# Patient Record
Sex: Female | Born: 1970 | Race: White | Hispanic: Yes | Marital: Married | Smoking: Never smoker
Health system: Southern US, Community
[De-identification: ages and names within clinical notes are randomized; demographics above are authoritative.]

## PROBLEM LIST (undated history)

## (undated) DIAGNOSIS — T7840XA Allergy, unspecified, initial encounter: Secondary | ICD-10-CM

## (undated) DIAGNOSIS — M810 Age-related osteoporosis without current pathological fracture: Secondary | ICD-10-CM

## (undated) DIAGNOSIS — E119 Type 2 diabetes mellitus without complications: Secondary | ICD-10-CM

## (undated) DIAGNOSIS — I4891 Unspecified atrial fibrillation: Secondary | ICD-10-CM

## (undated) DIAGNOSIS — J309 Allergic rhinitis, unspecified: Secondary | ICD-10-CM

## (undated) DIAGNOSIS — E079 Disorder of thyroid, unspecified: Secondary | ICD-10-CM

## (undated) DIAGNOSIS — L719 Rosacea, unspecified: Secondary | ICD-10-CM

## (undated) DIAGNOSIS — M8580 Other specified disorders of bone density and structure, unspecified site: Secondary | ICD-10-CM

## (undated) DIAGNOSIS — G4730 Sleep apnea, unspecified: Secondary | ICD-10-CM

## (undated) DIAGNOSIS — A6000 Herpesviral infection of urogenital system, unspecified: Secondary | ICD-10-CM

## (undated) HISTORY — DX: Herpesviral infection of urogenital system, unspecified: A60.00

## (undated) HISTORY — DX: Unspecified atrial fibrillation: I48.91

## (undated) HISTORY — DX: Rosacea, unspecified: L71.9

## (undated) HISTORY — DX: Disorder of thyroid, unspecified: E07.9

## (undated) HISTORY — DX: Age-related osteoporosis without current pathological fracture: M81.0

## (undated) HISTORY — DX: Allergic rhinitis, unspecified: J30.9

## (undated) HISTORY — PX: LAPAROSCOPIC GASTRIC BAND REMOVAL WITH LAPAROSCOPIC GASTRIC SLEEVE RESECTION: SHX6498

## (undated) HISTORY — DX: Sleep apnea, unspecified: G47.30

## (undated) HISTORY — DX: Type 2 diabetes mellitus without complications: E11.9

## (undated) HISTORY — DX: Other specified disorders of bone density and structure, unspecified site: M85.80

## (undated) HISTORY — PX: THYROID SURGERY: SHX805

## (undated) HISTORY — PX: TUBAL LIGATION: SHX77

## (undated) HISTORY — DX: Allergy, unspecified, initial encounter: T78.40XA

---

## 1998-12-15 ENCOUNTER — Emergency Department (HOSPITAL_COMMUNITY): Admission: EM | Admit: 1998-12-15 | Discharge: 1998-12-16 | Payer: Self-pay | Admitting: Emergency Medicine

## 1999-04-01 ENCOUNTER — Other Ambulatory Visit: Admission: RE | Admit: 1999-04-01 | Discharge: 1999-04-01 | Payer: Self-pay | Admitting: Obstetrics and Gynecology

## 1999-07-24 ENCOUNTER — Ambulatory Visit (HOSPITAL_COMMUNITY): Admission: RE | Admit: 1999-07-24 | Discharge: 1999-07-24 | Payer: Self-pay | Admitting: Obstetrics and Gynecology

## 1999-10-04 ENCOUNTER — Inpatient Hospital Stay (HOSPITAL_COMMUNITY): Admission: AD | Admit: 1999-10-04 | Discharge: 1999-10-05 | Payer: Self-pay | Admitting: *Deleted

## 1999-10-04 ENCOUNTER — Encounter (INDEPENDENT_AMBULATORY_CARE_PROVIDER_SITE_OTHER): Payer: Self-pay | Admitting: Specialist

## 1999-10-30 ENCOUNTER — Inpatient Hospital Stay (HOSPITAL_COMMUNITY): Admission: EM | Admit: 1999-10-30 | Discharge: 1999-10-30 | Payer: Self-pay | Admitting: Obstetrics

## 2000-06-14 HISTORY — PX: OTHER SURGICAL HISTORY: SHX169

## 2000-09-15 ENCOUNTER — Encounter: Admission: RE | Admit: 2000-09-15 | Discharge: 2000-09-15 | Payer: Self-pay | Admitting: *Deleted

## 2000-09-15 ENCOUNTER — Encounter: Payer: Self-pay | Admitting: Pediatrics

## 2002-03-13 ENCOUNTER — Other Ambulatory Visit: Admission: RE | Admit: 2002-03-13 | Discharge: 2002-03-13 | Payer: Self-pay | Admitting: Obstetrics and Gynecology

## 2004-04-01 ENCOUNTER — Ambulatory Visit: Payer: Self-pay | Admitting: Internal Medicine

## 2004-04-25 DIAGNOSIS — B009 Herpesviral infection, unspecified: Secondary | ICD-10-CM

## 2004-06-04 ENCOUNTER — Ambulatory Visit: Payer: Self-pay | Admitting: Internal Medicine

## 2004-06-05 ENCOUNTER — Ambulatory Visit: Payer: Self-pay | Admitting: *Deleted

## 2004-06-11 ENCOUNTER — Encounter (HOSPITAL_COMMUNITY): Admission: RE | Admit: 2004-06-11 | Discharge: 2004-09-09 | Payer: Self-pay | Admitting: Internal Medicine

## 2004-06-19 ENCOUNTER — Ambulatory Visit: Payer: Self-pay | Admitting: Internal Medicine

## 2004-06-25 ENCOUNTER — Ambulatory Visit: Payer: Self-pay | Admitting: Family Medicine

## 2004-06-25 ENCOUNTER — Inpatient Hospital Stay (HOSPITAL_COMMUNITY): Admission: EM | Admit: 2004-06-25 | Discharge: 2004-06-27 | Payer: Self-pay | Admitting: Emergency Medicine

## 2004-06-25 ENCOUNTER — Ambulatory Visit: Payer: Self-pay | Admitting: Cardiology

## 2004-06-26 ENCOUNTER — Encounter: Payer: Self-pay | Admitting: Cardiology

## 2004-06-30 ENCOUNTER — Ambulatory Visit: Payer: Self-pay | Admitting: Internal Medicine

## 2004-07-15 ENCOUNTER — Ambulatory Visit: Payer: Self-pay | Admitting: Internal Medicine

## 2004-07-31 ENCOUNTER — Ambulatory Visit: Payer: Self-pay | Admitting: Family Medicine

## 2004-08-07 ENCOUNTER — Ambulatory Visit: Payer: Self-pay | Admitting: Internal Medicine

## 2004-08-14 ENCOUNTER — Encounter (HOSPITAL_COMMUNITY): Admission: RE | Admit: 2004-08-14 | Discharge: 2004-11-12 | Payer: Self-pay | Admitting: Internal Medicine

## 2004-10-05 ENCOUNTER — Ambulatory Visit: Payer: Self-pay | Admitting: Internal Medicine

## 2004-10-13 ENCOUNTER — Ambulatory Visit: Payer: Self-pay | Admitting: Internal Medicine

## 2004-12-11 ENCOUNTER — Ambulatory Visit: Payer: Self-pay | Admitting: Internal Medicine

## 2004-12-24 ENCOUNTER — Ambulatory Visit: Payer: Self-pay | Admitting: Internal Medicine

## 2005-03-05 ENCOUNTER — Ambulatory Visit: Payer: Self-pay | Admitting: Internal Medicine

## 2005-03-10 ENCOUNTER — Ambulatory Visit: Payer: Self-pay | Admitting: Internal Medicine

## 2005-03-23 ENCOUNTER — Ambulatory Visit: Payer: Self-pay | Admitting: *Deleted

## 2005-06-04 ENCOUNTER — Ambulatory Visit: Payer: Self-pay | Admitting: Internal Medicine

## 2005-06-15 ENCOUNTER — Ambulatory Visit: Payer: Self-pay | Admitting: Internal Medicine

## 2005-06-22 ENCOUNTER — Ambulatory Visit: Payer: Self-pay | Admitting: Internal Medicine

## 2005-07-08 ENCOUNTER — Encounter (HOSPITAL_COMMUNITY): Admission: RE | Admit: 2005-07-08 | Discharge: 2005-10-06 | Payer: Self-pay | Admitting: Internal Medicine

## 2005-07-18 ENCOUNTER — Emergency Department (HOSPITAL_COMMUNITY): Admission: EM | Admit: 2005-07-18 | Discharge: 2005-07-18 | Payer: Self-pay | Admitting: Emergency Medicine

## 2005-07-26 ENCOUNTER — Ambulatory Visit: Payer: Self-pay | Admitting: Internal Medicine

## 2005-08-19 ENCOUNTER — Ambulatory Visit: Payer: Self-pay | Admitting: Internal Medicine

## 2005-09-17 ENCOUNTER — Ambulatory Visit: Payer: Self-pay | Admitting: Internal Medicine

## 2005-10-21 ENCOUNTER — Ambulatory Visit: Payer: Self-pay | Admitting: Internal Medicine

## 2005-12-13 ENCOUNTER — Ambulatory Visit: Payer: Self-pay | Admitting: Internal Medicine

## 2005-12-14 ENCOUNTER — Ambulatory Visit (HOSPITAL_COMMUNITY): Admission: RE | Admit: 2005-12-14 | Discharge: 2005-12-14 | Payer: Self-pay | Admitting: Internal Medicine

## 2005-12-21 ENCOUNTER — Ambulatory Visit (HOSPITAL_COMMUNITY): Admission: RE | Admit: 2005-12-21 | Discharge: 2005-12-21 | Payer: Self-pay | Admitting: Internal Medicine

## 2005-12-27 ENCOUNTER — Ambulatory Visit: Payer: Self-pay | Admitting: Internal Medicine

## 2006-01-04 ENCOUNTER — Ambulatory Visit: Payer: Self-pay | Admitting: Internal Medicine

## 2006-01-05 ENCOUNTER — Encounter (INDEPENDENT_AMBULATORY_CARE_PROVIDER_SITE_OTHER): Payer: Self-pay | Admitting: Family Medicine

## 2006-01-05 ENCOUNTER — Ambulatory Visit: Payer: Self-pay | Admitting: Family Medicine

## 2006-01-26 ENCOUNTER — Ambulatory Visit: Payer: Self-pay | Admitting: Family Medicine

## 2006-02-11 ENCOUNTER — Ambulatory Visit: Payer: Self-pay | Admitting: Family Medicine

## 2006-02-15 ENCOUNTER — Ambulatory Visit: Payer: Self-pay | Admitting: Internal Medicine

## 2006-04-18 ENCOUNTER — Ambulatory Visit: Payer: Self-pay | Admitting: Internal Medicine

## 2006-06-01 ENCOUNTER — Ambulatory Visit: Payer: Self-pay | Admitting: Internal Medicine

## 2006-09-01 ENCOUNTER — Ambulatory Visit: Payer: Self-pay | Admitting: Internal Medicine

## 2006-10-03 ENCOUNTER — Ambulatory Visit: Payer: Self-pay | Admitting: Family Medicine

## 2006-10-15 ENCOUNTER — Encounter (INDEPENDENT_AMBULATORY_CARE_PROVIDER_SITE_OTHER): Payer: Self-pay | Admitting: Family Medicine

## 2006-10-15 DIAGNOSIS — L719 Rosacea, unspecified: Secondary | ICD-10-CM

## 2006-10-15 DIAGNOSIS — E669 Obesity, unspecified: Secondary | ICD-10-CM | POA: Insufficient documentation

## 2006-10-15 HISTORY — DX: Rosacea, unspecified: L71.9

## 2006-12-21 ENCOUNTER — Ambulatory Visit: Payer: Self-pay | Admitting: Family Medicine

## 2007-03-01 ENCOUNTER — Encounter (INDEPENDENT_AMBULATORY_CARE_PROVIDER_SITE_OTHER): Payer: Self-pay | Admitting: *Deleted

## 2007-03-06 ENCOUNTER — Ambulatory Visit: Payer: Self-pay | Admitting: Nurse Practitioner

## 2007-03-06 ENCOUNTER — Encounter (INDEPENDENT_AMBULATORY_CARE_PROVIDER_SITE_OTHER): Payer: Self-pay | Admitting: Family Medicine

## 2007-03-08 LAB — CONVERTED CEMR LAB: TSH: 2.995 microintl units/mL (ref 0.350–5.50)

## 2007-03-17 ENCOUNTER — Telehealth (INDEPENDENT_AMBULATORY_CARE_PROVIDER_SITE_OTHER): Payer: Self-pay | Admitting: *Deleted

## 2007-04-05 ENCOUNTER — Ambulatory Visit: Payer: Self-pay | Admitting: Nurse Practitioner

## 2007-04-05 DIAGNOSIS — E018 Other iodine-deficiency related thyroid disorders and allied conditions: Secondary | ICD-10-CM | POA: Insufficient documentation

## 2007-04-05 DIAGNOSIS — I4891 Unspecified atrial fibrillation: Secondary | ICD-10-CM | POA: Insufficient documentation

## 2007-04-05 HISTORY — DX: Unspecified atrial fibrillation: I48.91

## 2007-04-06 ENCOUNTER — Encounter (INDEPENDENT_AMBULATORY_CARE_PROVIDER_SITE_OTHER): Payer: Self-pay | Admitting: Nurse Practitioner

## 2007-04-12 ENCOUNTER — Encounter (INDEPENDENT_AMBULATORY_CARE_PROVIDER_SITE_OTHER): Payer: Self-pay | Admitting: Nurse Practitioner

## 2007-10-23 ENCOUNTER — Telehealth (INDEPENDENT_AMBULATORY_CARE_PROVIDER_SITE_OTHER): Payer: Self-pay | Admitting: *Deleted

## 2007-10-23 ENCOUNTER — Ambulatory Visit: Payer: Self-pay | Admitting: Nurse Practitioner

## 2007-11-16 ENCOUNTER — Telehealth (INDEPENDENT_AMBULATORY_CARE_PROVIDER_SITE_OTHER): Payer: Self-pay | Admitting: Nurse Practitioner

## 2007-12-19 IMAGING — CT CT HEAD W/O CM
1 series · 16 of 30 positions shown, 20 images · IV contrast (agent unspecified)
Comparison: none

CLINICAL DATA: Headaches.  Diplopia.  
HEAD CT WITHOUT CONTRAST:
TECHNIQUE: Contiguous axial images were obtained from the base of the skull through the vertex according to standard protocol without contrast.

[Series 2: head_seq 5.0 h45s · axial · 0.43mm/px · z∈[+1209,+1343]mm · 16 of 30 slices shown, 20 images]
[im 2/30  brain]
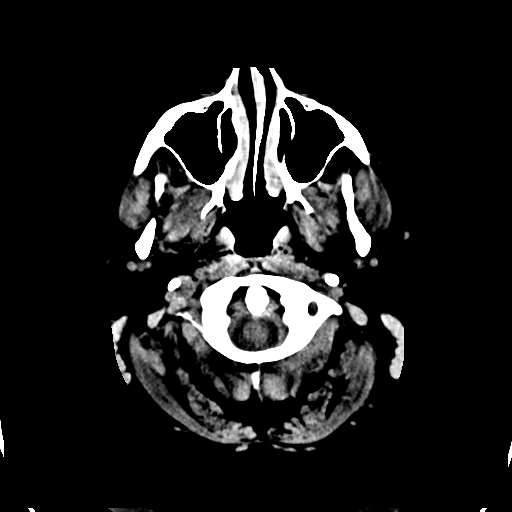
[im 2/30  bone]
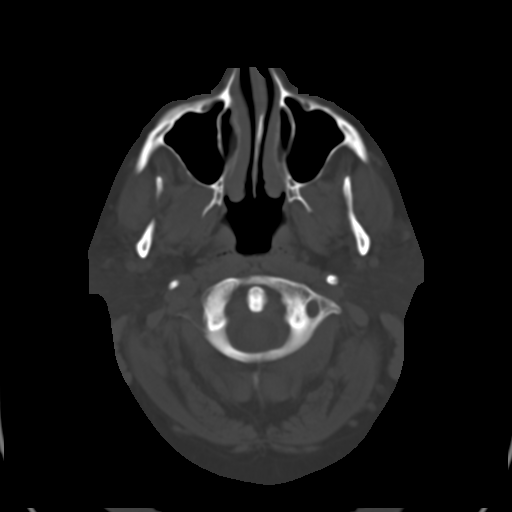
[im 4/30  brain]
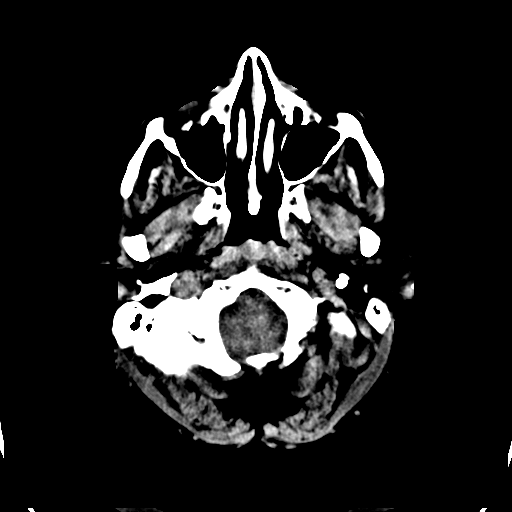
[im 6/30  brain]
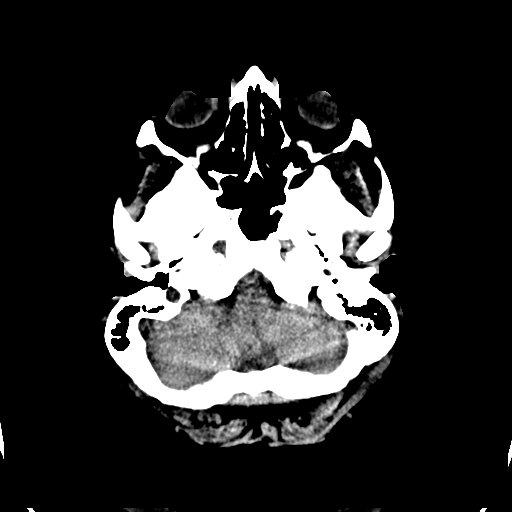
[im 8/30  brain]
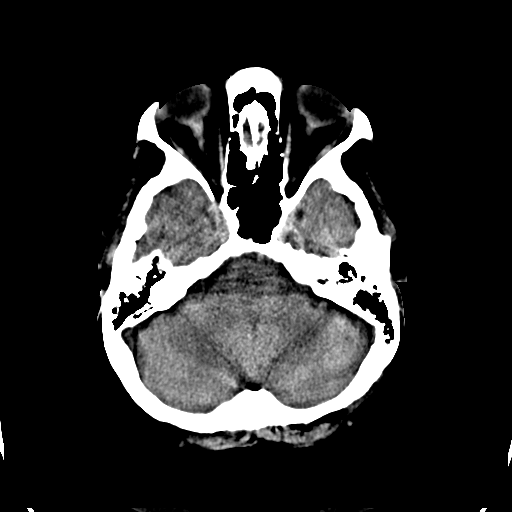
[im 9/30  brain]
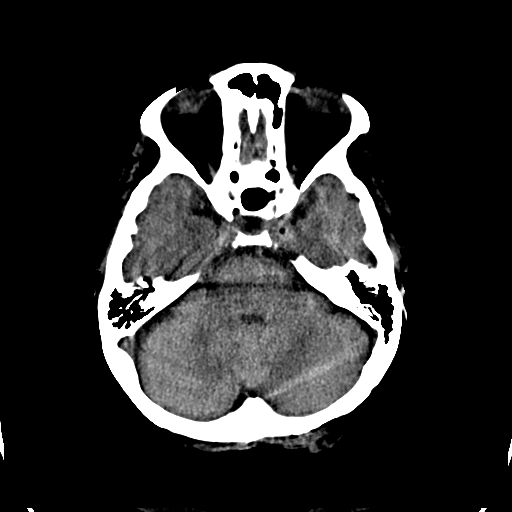
[im 9/30  bone]
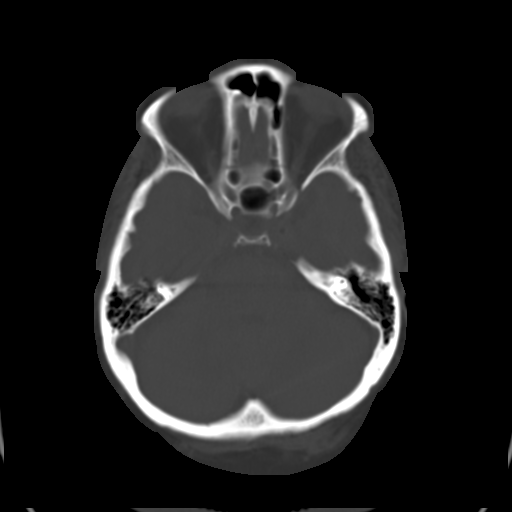
[im 11/30  brain]
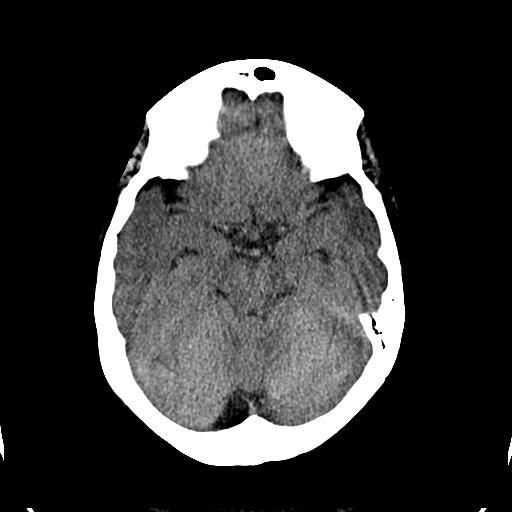
[im 13/30  brain]
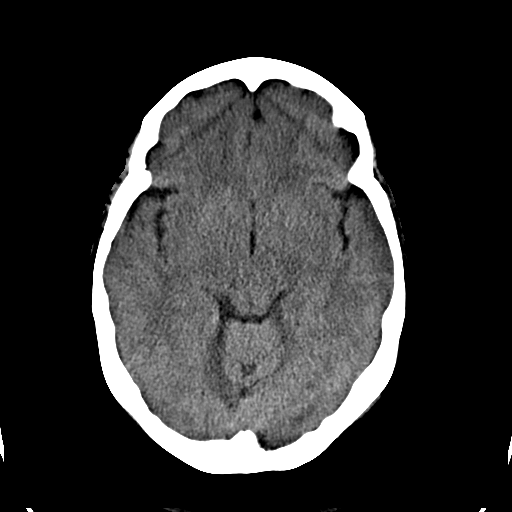
[im 15/30  brain]
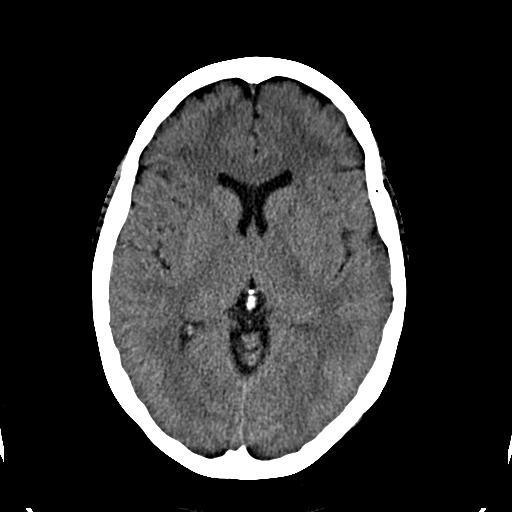
[im 16/30  brain]
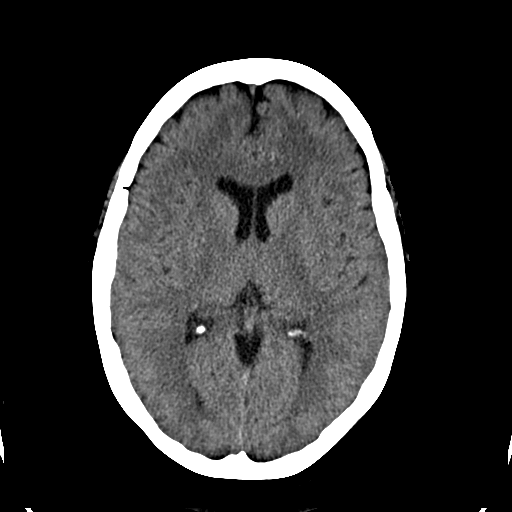
[im 16/30  bone]
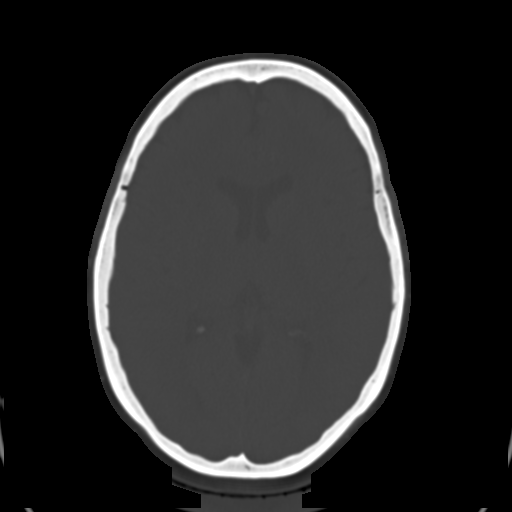
[im 18/30  brain]
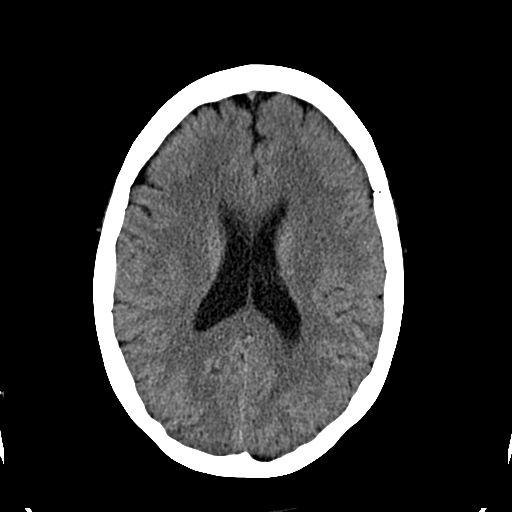
[im 20/30  brain]
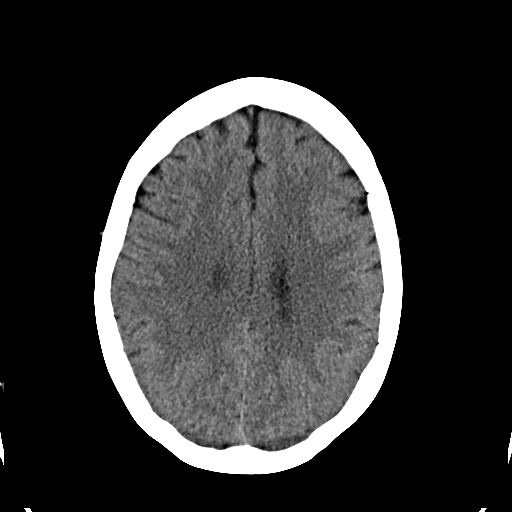
[im 22/30  brain]
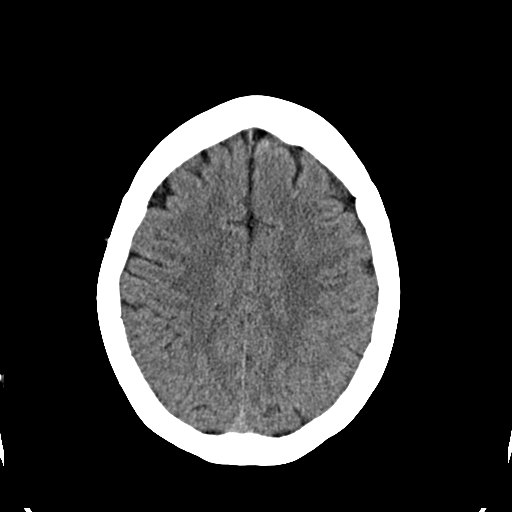
[im 23/30  brain]
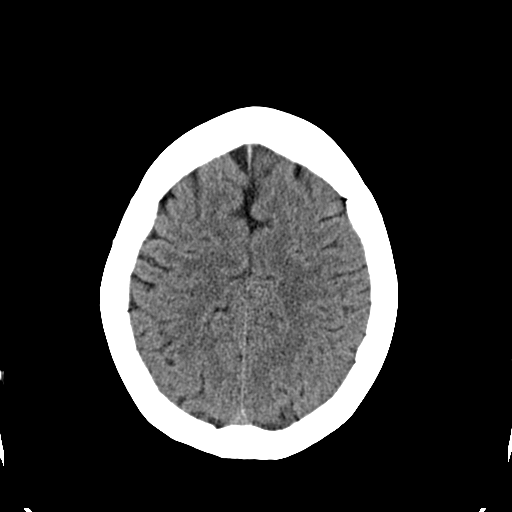
[im 23/30  bone]
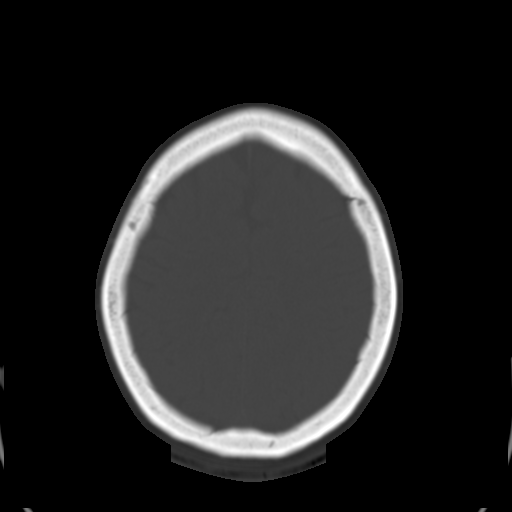
[im 25/30  brain]
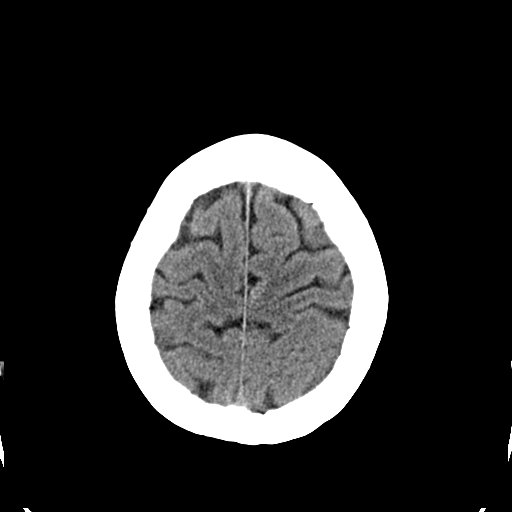
[im 27/30  brain]
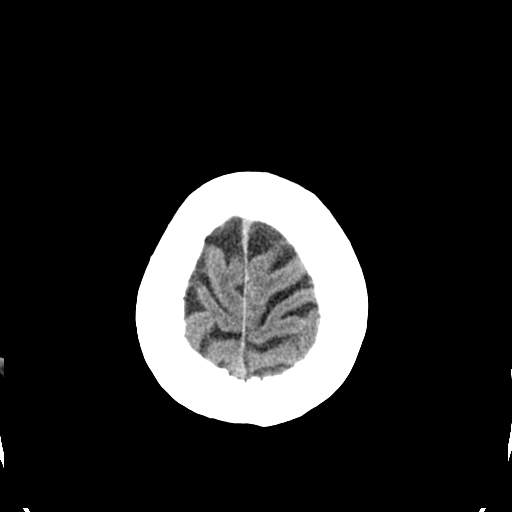
[im 29/30  brain]
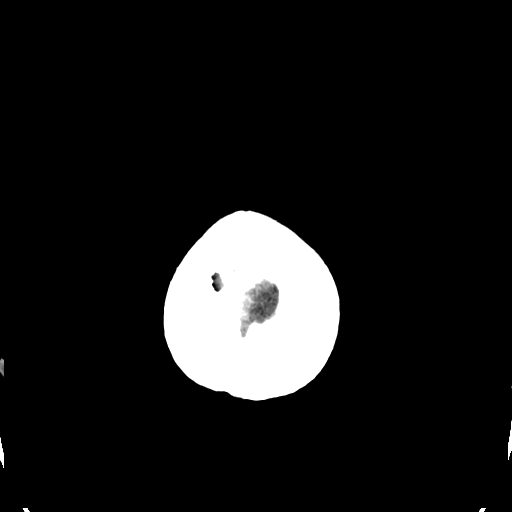

[16 of 30 positions shown; findings below may reference images not displayed]

FINDINGS: Fourth ventricle and its recesses have an attenuated appearance.  There is a ?crowded? appearance of the inferior aspect of the posterior fossa.  Findings raise the question of mass effect in that region possibly due to Chiari I malformation.  I am suspicious that the cerebellar tonsils are low lying.  MRI would be helpful for further assessment in that regard.  
No hydrocephalus.  Given the patient?s age, there are mild cortical atrophic changes involving the frontal lobes and mild cerebellar atrophic changes.  Suspicion for empty sella.
IMPRESSION: 1.  Suspicion for posterior fossa mass effect, e.g., Chiari I malformation.  Recommend MRI for further evaluation.  
2.  There are some findings described above plus reported clinical findings which raise the question of pseudotumor cerebri.

## 2008-01-24 ENCOUNTER — Ambulatory Visit: Payer: Self-pay | Admitting: Nurse Practitioner

## 2008-01-24 LAB — CONVERTED CEMR LAB: TSH: 1.379 microintl units/mL (ref 0.350–4.50)

## 2008-01-25 ENCOUNTER — Encounter (INDEPENDENT_AMBULATORY_CARE_PROVIDER_SITE_OTHER): Payer: Self-pay | Admitting: Nurse Practitioner

## 2008-03-14 ENCOUNTER — Ambulatory Visit: Payer: Self-pay | Admitting: Nurse Practitioner

## 2008-03-14 DIAGNOSIS — R3129 Other microscopic hematuria: Secondary | ICD-10-CM | POA: Insufficient documentation

## 2008-03-15 ENCOUNTER — Encounter (INDEPENDENT_AMBULATORY_CARE_PROVIDER_SITE_OTHER): Payer: Self-pay | Admitting: Nurse Practitioner

## 2008-03-21 ENCOUNTER — Encounter (INDEPENDENT_AMBULATORY_CARE_PROVIDER_SITE_OTHER): Payer: Self-pay | Admitting: Nurse Practitioner

## 2008-07-29 ENCOUNTER — Telehealth (INDEPENDENT_AMBULATORY_CARE_PROVIDER_SITE_OTHER): Payer: Self-pay | Admitting: Nurse Practitioner

## 2008-08-12 ENCOUNTER — Ambulatory Visit: Payer: Self-pay | Admitting: Nurse Practitioner

## 2008-08-13 ENCOUNTER — Encounter (INDEPENDENT_AMBULATORY_CARE_PROVIDER_SITE_OTHER): Payer: Self-pay | Admitting: Nurse Practitioner

## 2008-08-13 LAB — CONVERTED CEMR LAB: TSH: 0.96 microintl units/mL (ref 0.350–4.50)

## 2008-09-23 ENCOUNTER — Ambulatory Visit: Payer: Self-pay | Admitting: Nurse Practitioner

## 2008-09-24 ENCOUNTER — Encounter (INDEPENDENT_AMBULATORY_CARE_PROVIDER_SITE_OTHER): Payer: Self-pay | Admitting: Nurse Practitioner

## 2008-10-04 ENCOUNTER — Ambulatory Visit: Payer: Self-pay | Admitting: Nurse Practitioner

## 2008-10-04 DIAGNOSIS — J309 Allergic rhinitis, unspecified: Secondary | ICD-10-CM

## 2008-10-04 HISTORY — DX: Allergic rhinitis, unspecified: J30.9

## 2008-10-10 ENCOUNTER — Ambulatory Visit (HOSPITAL_COMMUNITY): Admission: RE | Admit: 2008-10-10 | Discharge: 2008-10-10 | Payer: Self-pay | Admitting: Nurse Practitioner

## 2008-10-10 ENCOUNTER — Encounter (INDEPENDENT_AMBULATORY_CARE_PROVIDER_SITE_OTHER): Payer: Self-pay | Admitting: Nurse Practitioner

## 2008-12-05 ENCOUNTER — Telehealth (INDEPENDENT_AMBULATORY_CARE_PROVIDER_SITE_OTHER): Payer: Self-pay | Admitting: Nurse Practitioner

## 2008-12-12 ENCOUNTER — Ambulatory Visit: Payer: Self-pay | Admitting: Physician Assistant

## 2008-12-13 ENCOUNTER — Encounter: Payer: Self-pay | Admitting: Physician Assistant

## 2008-12-13 LAB — CONVERTED CEMR LAB
BUN: 17 mg/dL (ref 6–23)
Basophils Absolute: 0 10*3/uL (ref 0.0–0.1)
CO2: 22 meq/L (ref 19–32)
Chloride: 105 meq/L (ref 96–112)
Creatinine, Ser: 0.73 mg/dL (ref 0.40–1.20)
Glucose, Bld: 80 mg/dL (ref 70–99)
HCT: 38.6 % (ref 36.0–46.0)
Hemoglobin: 12.1 g/dL (ref 12.0–15.0)
Lymphs Abs: 2.5 10*3/uL (ref 0.7–4.0)
MCHC: 31.3 g/dL (ref 30.0–36.0)
MCV: 78.1 fL (ref 78.0–100.0)
Monocytes Relative: 6 % (ref 3–12)
Neutrophils Relative %: 61 % (ref 43–77)
Platelets: 310 10*3/uL (ref 150–400)
RBC: 4.94 M/uL (ref 3.87–5.11)
RDW: 17.6 % — ABNORMAL HIGH (ref 11.5–15.5)
Sodium: 139 meq/L (ref 135–145)
TSH: 1.818 microintl units/mL (ref 0.350–4.500)

## 2008-12-24 ENCOUNTER — Telehealth (INDEPENDENT_AMBULATORY_CARE_PROVIDER_SITE_OTHER): Payer: Self-pay | Admitting: Nurse Practitioner

## 2009-03-06 ENCOUNTER — Ambulatory Visit: Payer: Self-pay | Admitting: Nurse Practitioner

## 2009-03-06 ENCOUNTER — Encounter (INDEPENDENT_AMBULATORY_CARE_PROVIDER_SITE_OTHER): Payer: Self-pay | Admitting: Internal Medicine

## 2009-03-06 ENCOUNTER — Telehealth (INDEPENDENT_AMBULATORY_CARE_PROVIDER_SITE_OTHER): Payer: Self-pay | Admitting: Nurse Practitioner

## 2009-03-06 LAB — CONVERTED CEMR LAB
Protein, U semiquant: NEGATIVE
pH: 6

## 2009-03-14 ENCOUNTER — Encounter (INDEPENDENT_AMBULATORY_CARE_PROVIDER_SITE_OTHER): Payer: Self-pay | Admitting: Nurse Practitioner

## 2009-03-14 ENCOUNTER — Ambulatory Visit: Payer: Self-pay | Admitting: Physician Assistant

## 2009-03-18 ENCOUNTER — Encounter (INDEPENDENT_AMBULATORY_CARE_PROVIDER_SITE_OTHER): Payer: Self-pay | Admitting: Nurse Practitioner

## 2009-03-18 LAB — CONVERTED CEMR LAB
Hemoglobin: 13 g/dL (ref 12.0–15.0)
MCHC: 30.7 g/dL (ref 30.0–36.0)
MCV: 87 fL (ref 78.0–100.0)
Platelets: 286 10*3/uL (ref 150–400)
RBC: 4.86 M/uL (ref 3.87–5.11)
WBC: 5.6 10*3/uL (ref 4.0–10.5)

## 2009-06-30 ENCOUNTER — Ambulatory Visit: Payer: Self-pay | Admitting: Nurse Practitioner

## 2009-06-30 LAB — CONVERTED CEMR LAB
HCT: 42.1 % (ref 36.0–46.0)
Hemoglobin: 13.1 g/dL (ref 12.0–15.0)
MCHC: 31.1 g/dL (ref 30.0–36.0)
MCV: 83.9 fL (ref 78.0–100.0)
Platelets: 303 10*3/uL (ref 150–400)

## 2009-07-01 ENCOUNTER — Encounter (INDEPENDENT_AMBULATORY_CARE_PROVIDER_SITE_OTHER): Payer: Self-pay | Admitting: Nurse Practitioner

## 2009-07-15 ENCOUNTER — Ambulatory Visit: Payer: Self-pay | Admitting: Nurse Practitioner

## 2009-07-15 LAB — CONVERTED CEMR LAB
ALT: 15 units/L (ref 0–35)
AST: 16 units/L (ref 0–37)
Albumin: 4.1 g/dL (ref 3.5–5.2)
Alkaline Phosphatase: 83 units/L (ref 39–117)
BUN: 17 mg/dL (ref 6–23)
Bilirubin Urine: NEGATIVE
Calcium: 9 mg/dL (ref 8.4–10.5)
Glucose, Bld: 73 mg/dL (ref 70–99)
KOH Prep: NEGATIVE
Nitrite: NEGATIVE
Potassium: 4.4 meq/L (ref 3.5–5.3)
Sodium: 136 meq/L (ref 135–145)
Total Protein: 7.8 g/dL (ref 6.0–8.3)
Triglycerides: 86 mg/dL (ref <150)
Urobilinogen, UA: 0.2
VLDL: 17 mg/dL (ref 0–40)

## 2009-07-16 ENCOUNTER — Encounter (INDEPENDENT_AMBULATORY_CARE_PROVIDER_SITE_OTHER): Payer: Self-pay | Admitting: Nurse Practitioner

## 2009-07-23 LAB — CONVERTED CEMR LAB: Pap Smear: NEGATIVE

## 2009-08-29 ENCOUNTER — Ambulatory Visit: Payer: Self-pay | Admitting: Diagnostic Radiology

## 2009-08-29 ENCOUNTER — Emergency Department (HOSPITAL_BASED_OUTPATIENT_CLINIC_OR_DEPARTMENT_OTHER): Admission: EM | Admit: 2009-08-29 | Discharge: 2009-08-29 | Payer: Self-pay | Admitting: Emergency Medicine

## 2009-11-18 ENCOUNTER — Ambulatory Visit: Payer: Self-pay | Admitting: Family Medicine

## 2009-11-18 DIAGNOSIS — H8110 Benign paroxysmal vertigo, unspecified ear: Secondary | ICD-10-CM

## 2009-12-10 ENCOUNTER — Ambulatory Visit: Payer: Self-pay | Admitting: Family Medicine

## 2009-12-10 DIAGNOSIS — I10 Essential (primary) hypertension: Secondary | ICD-10-CM | POA: Insufficient documentation

## 2009-12-24 ENCOUNTER — Encounter: Admission: RE | Admit: 2009-12-24 | Discharge: 2010-03-10 | Payer: Self-pay | Admitting: Family Medicine

## 2009-12-24 ENCOUNTER — Encounter: Payer: Self-pay | Admitting: Family Medicine

## 2010-01-08 ENCOUNTER — Ambulatory Visit: Payer: Self-pay | Admitting: Family Medicine

## 2010-01-11 ENCOUNTER — Encounter: Payer: Self-pay | Admitting: Family Medicine

## 2010-02-11 ENCOUNTER — Ambulatory Visit: Payer: Self-pay | Admitting: Family Medicine

## 2010-02-11 DIAGNOSIS — R6882 Decreased libido: Secondary | ICD-10-CM

## 2010-03-13 ENCOUNTER — Ambulatory Visit: Payer: Self-pay | Admitting: Family Medicine

## 2010-03-21 ENCOUNTER — Telehealth: Payer: Self-pay | Admitting: Family Medicine

## 2010-04-23 ENCOUNTER — Ambulatory Visit: Payer: Self-pay | Admitting: Family Medicine

## 2010-04-23 ENCOUNTER — Encounter: Payer: Self-pay | Admitting: Family Medicine

## 2010-04-23 LAB — CONVERTED CEMR LAB
Platelets: 308 10*3/uL (ref 150–400)
RDW: 15.9 % — ABNORMAL HIGH (ref 11.5–15.5)
WBC: 8.4 10*3/uL (ref 4.0–10.5)

## 2010-04-27 ENCOUNTER — Telehealth: Payer: Self-pay | Admitting: Family Medicine

## 2010-05-04 ENCOUNTER — Ambulatory Visit: Payer: Self-pay | Admitting: Family Medicine

## 2010-05-05 ENCOUNTER — Telehealth: Payer: Self-pay | Admitting: Family Medicine

## 2010-05-05 ENCOUNTER — Telehealth: Payer: Self-pay | Admitting: *Deleted

## 2010-05-19 ENCOUNTER — Encounter: Payer: Self-pay | Admitting: Family Medicine

## 2010-07-12 LAB — CONVERTED CEMR LAB
ALT: 15 units/L (ref 0–35)
AST: 16 units/L (ref 0–37)
AST: 17 units/L (ref 0–37)
Albumin: 4.2 g/dL (ref 3.5–5.2)
BUN: 11 mg/dL (ref 6–23)
Basophils Relative: 0 % (ref 0–1)
Basophils Relative: 0 % (ref 0–1)
Bilirubin Urine: NEGATIVE
Bilirubin Urine: NEGATIVE
Blood in Urine, dipstick: NEGATIVE
CO2: 21 meq/L (ref 19–32)
Chloride: 103 meq/L (ref 96–112)
Chloride: 103 meq/L (ref 96–112)
Creatinine, Ser: 0.67 mg/dL (ref 0.40–1.20)
Creatinine, Ser: 0.75 mg/dL (ref 0.40–1.20)
Eosinophils Absolute: 0.1 10*3/uL (ref 0.0–0.7)
GC Probe Amp, Genital: NEGATIVE
Glucose, Bld: 83 mg/dL (ref 70–99)
HCT: 38 % (ref 36.0–46.0)
Hemoglobin: 11.7 g/dL — ABNORMAL LOW (ref 12.0–15.0)
KOH Prep: NEGATIVE
Ketones, urine, test strip: NEGATIVE
Ketones, urine, test strip: NEGATIVE
Lymphs Abs: 2.3 10*3/uL (ref 0.7–3.3)
Lymphs Abs: 2.6 10*3/uL (ref 0.7–4.0)
MCHC: 30.9 g/dL (ref 30.0–36.0)
MCV: 75.5 fL — ABNORMAL LOW (ref 78.0–100.0)
MCV: 77.2 fL — ABNORMAL LOW (ref 78.0–100.0)
Monocytes Absolute: 0.4 10*3/uL (ref 0.1–1.0)
Monocytes Relative: 6 % (ref 3–12)
Neutrophils Relative %: 59 % (ref 43–77)
Nitrite: NEGATIVE
Nitrite: NEGATIVE
Pap Smear: NORMAL
Platelets: 335 10*3/uL (ref 150–400)
Protein, U semiquant: NEGATIVE
RDW: 16.3 % — ABNORMAL HIGH (ref 11.5–15.5)
RDW: 16.7 % — ABNORMAL HIGH (ref 11.5–14.0)
Sodium: 139 meq/L (ref 135–145)
Sodium: 141 meq/L (ref 135–145)
TSH: 2.913 microintl units/mL (ref 0.350–4.50)
Total Bilirubin: 0.3 mg/dL (ref 0.3–1.2)
Total Protein: 7.7 g/dL (ref 6.0–8.3)
Total Protein: 7.8 g/dL (ref 6.0–8.3)
Urobilinogen, UA: 0.2
WBC: 7.7 10*3/uL (ref 4.0–10.5)
pH: 6

## 2010-07-14 NOTE — Assessment & Plan Note (Signed)
Summary: dizziness/weight/rosacea   Vital Signs:  Patient profile:   40 year old female Menstrual status:  regular Weight:      183.3 pounds Pulse rate:   94 / minute BP sitting:   132 / 88  (right arm)  Vitals Entered By: Arlyss Repress CMA, (April 23, 2010 2:50 PM) CC: f/up last OV   CC:  f/up last OV.  History of Present Illness: dizziness: dizziness once while standing cleaning table, again while sitting at a computer, again while going to lay down had dizziness episode.  lasts 2 seconds then resolves completely. no syncope. no nausea/ vomiting. no palpitations. no cp.  no sob   drinking less water since weather is now cold.  no dizziness when laying down.  Pt states that this is very different than when she had problems with vertigo.  weight: pt continues to work on weight loss.  Has been meeting with the nutritionist to discuss nutrition and exercise.  pt has lost approx 6 lbs.  pt is also taking phentermine for weight loss-  discuss the cons of taking this medication with pt at previous appt.  this is prescribed by a "bariatric clinic".  rosacea: pt has long hx of rosesea.  used a friends steroid cream on her face and this seemed to help.  states that sometimes the red area on her face gets dry patches as well.  no buring, no itching, no pimples.     Current Medications (verified): 1)  Excedrin Migraine 250-250-65 Mg Tabs (Aspirin-Acetaminophen-Caffeine) .... As Needed Migraines 2)  Synthroid 150 Mcg Tabs (Levothyroxine Sodium) .... One Tablet By Mouth Daily  For Thyroid 3)  Loratadine 10 Mg Tabs (Loratadine) .Marland Kitchen.. 1 Tablet By Mouth Daily For Allergies 4)  Phentermine Hcl 15 Mg Caps (Phentermine Hcl) .... Usure of Dosage 5)  Metrogel 1 % Gel (Metronidazole) .... Apply Small Amout To Red Areas On Face Once Daily  Allergies (verified): No Known Drug Allergies  Review of Systems       as per hpi  Physical Exam  General:  VSS orthostatics lying- 130/90   hr-70 standing 130/84  hr-80 obese ,in no acute distress; alert,appropriate and cooperative throughout examination Lungs:  Normal respiratory effort, chest expands symmetrically. Lungs are clear to auscultation, no crackles or wheezes. Heart:  Normal rate and regular rhythm. S1 and S2 normal without gallop, murmur, click, rub or other extra sounds. Extremities:  no edema Neurologic:  CN II-XII grossly intact, unable to cause dizziness or nystagmus with dix-hallpike manuver, good strenth in upper and lower ext.  good balance. normal gait Skin:  red macular/papular area on cheeks, nose and forehead Psych:  Cognition and judgment appear intact. Alert and cooperative with normal attention span and concentration. 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  Hbg lab test ordered.  The following medications were removed from the medication list:    Ferrous Sulfate 325 (65 Fe) Mg Tabs (Ferrous sulfate) ..... Hold (stopped early 2011)  Orders: CBC-[facility] (04540)  Problem # 2:  DIZZINESS (ICD-780.4) most likely a side effect of the phentermine.  (very common side effect).  Could also be 2/2 to decreased by mouth intake.  orthostatic vital signs negative today and I was unable to reproduce syptoms.    The following medications were removed from the medication list:    Antivert  25 Mg Tabs (Meclizine hcl) .Marland Kitchen... 1/2 tablet as needed dizziness Her updated medication list for this problem includes:    Loratadine 10 Mg Tabs (Loratadine) .Marland Kitchen... 1 tablet by mouth daily for allergies  Orders: [facility]- Est  Level 4 (16109)  Problem # 3:  OBESITY NOS (ICD-278.00) pt encouraged to continue exercise and healthy eating.  congratulated her on weight loss.  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  Pt is to avoid using the steroid cream that her friend gave her since this can cause thinning of the facial skin.    Orders: [facility]- Est  Level 4 (09811)  Complete Medication List: 1)  Excedrin Migraine 250-250-65 Mg Tabs (Aspirin-acetaminophen-caffeine) .... As needed migraines 2)  Synthroid 150 Mcg Tabs (Levothyroxine sodium) .... One tablet by mouth daily  for thyroid 3)  Loratadine 10 Mg Tabs (Loratadine) .Marland Kitchen.. 1 tablet by mouth daily for allergies 4)  Phentermine Hcl 15 Mg Caps (Phentermine hcl) .... Usure of dosage 5)  Metrogel 1 % Gel (Metronidazole) .... Apply small amout to red areas on face once daily  Other Orders: TSH-[facility] ([id number])  Patient Instructions: 1)  drink lots of fluids. 2)  I think your symptoms may be a side effect of the phentermine 3)  we will check your TSH and blood for anemia today. 4)  see the nutritionist as scheduled 5)  return to see me in 1-2 months for recheck on weight and to review screening form 6)  clean skin every morning and night with "cetaphil" 7)  use "purpose" lotion every day and night Prescriptions: METROGEL 1 % GEL (METRONIDAZOLE) apply small amout to red areas on face once daily  #1 x 3   Entered and Authorized by:   Ellin Mayhew MD   Signed by:   Ellin Mayhew MD on 04/24/2010   Method used:   Electronically to        [facility] (retail)       [address]       [place], [place]  [address]       Ph: [phone]       Fax: [phone]   RxID:   [id number]    Orders Added: 1)  TSH-[facility] [59563-87564] 2)  CBC-[facility] [85027] 3)  [facility]- Est  Level 4 [33295]

## 2010-07-14 NOTE — Progress Notes (Signed)
Summary: rx  Phone Note Call from Patient   Caller: Patient Summary of Call: Deon Pilling called to request generic for Metrogel 1% because it is to expens ($300)  and she can not buy it.  Initial call taken by: Marines Jean Rosenthal,  April 27, 2010 10:09 AM  Follow-up for Phone Call        will forward to MD. Follow-up by: Theresia Lo RN,  April 27, 2010 12:38 PM  Additional Follow-up for Phone Call Additional follow up Details #1::        Sent message via digital page. Additional Follow-up by: Dennison Nancy RN,  April 28, 2010 1:51 PM    Additional Follow-up for Phone Call Additional follow up Details #2::    flag sent to MD . Follow-up by: Theresia Lo RN,  April 29, 2010 11:40 AM  Additional Follow-up for Phone Call Additional follow up Details #3:: Details for Additional Follow-up Action Taken:  paged Dr.  Edmonia James  regarding above message and she states she contacted pharmacy yesterday  and has several options for patient . she will call patient to discuss. patient notified that MD will call.   Attempt x 1 to call pt.  Left VM.  Ellin Mayhew MD  April 30, 2010 3:50 PM  Additional Follow-up by: Theresia Lo RN,  April 29, 2010 2:29 PM  see note continued on 05/05/2010. Theresia Lo RN  May 05, 2010 9:27 AM

## 2010-07-14 NOTE — Assessment & Plan Note (Signed)
Summary: FLU SHOT/MJ  Nurse Visit   Vital Signs:  Patient profile:   40 year old female Menstrual status:  regular Temp:     98.6 degrees F  Vitals Entered By: Theresia Lo RN (May 04, 2010 1:33 PM)  Allergies: No Known Drug Allergies  Immunizations Administered:  Influenza Vaccine # 1:    Vaccine Type: Fluvax 3+    Site: right deltoid    Mfr: GlaxoSmithKline    Dose: 0.5 ml    Route: IM    Given by: Theresia Lo RN    Exp. Date: 12/12/2010    Lot #: [id number]    VIS given: 01/06/10 version given May 04, 2010.  Flu Vaccine Consent Questions:    Do you have a history of severe allergic reactions to this vaccine? no    Any prior history of allergic reactions to egg and/or gelatin? no    Do you have a sensitivity to the preservative Thimersol? no    Do you have a past history of Guillan-Barre Syndrome? no    Do you currently have an acute febrile illness? no    Have you ever had a severe reaction to latex? no    Vaccine information given and explained to patient? yes    Are you currently pregnant? no  Orders Added: 1)  Flu Vaccine 29yrs + [90658] 2)  Admin 1st Vaccine [34742]
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
## 2010-07-14 NOTE — Progress Notes (Signed)
Summary: Office Visit//DEPRESSION SCREENING  Office Visit//DEPRESSION SCREENING   Imported By: Arta Bruce 09/04/2009 10:35:22  _____________________________________________________________________  External Attachment:    Type:   Image     Comment:   External Document

## 2010-07-14 NOTE — Assessment & Plan Note (Signed)
Summary: Hypothyroidism   Vital Signs:  Patient profile:   40 year old female Menstrual status:  irregular LMP:     06/19/2009 Height:      58.25 inches Weight:      176.5 pounds Pulse rate:   105 / minute Pulse rhythm:   regular Resp:     18 per minute BP sitting:   132 / 88  (left arm) Cuff size:   small  Vitals Entered By: Cammy Copa Harris(June 30, 2009 10:50 AM) CC: follow-up visit, labs, refill  Pain Assessment Patient in pain? yes     Location: knee, rt Intensity: 2 Type: dull Onset of pain  With activity  Does patient need assistance? Functional Status Self care Ambulation Normal Comments skin is dry on face, itching, redness, peeliong around begining of menses  LMP (date): 06/19/2009 LMP - Character: normal     Menstrual Status irregular Enter LMP: 06/19/2009 Last PAP Result NEGATIVE FOR INTRAEPITHELIAL LESIONS OR MALIGNANCY.   CC:  follow-up visit, labs, and refill .  History of Present Illness:  Pt into the office to f/u on thyroid labs. She is currently on thyroid medications.  She has been taking daily. No acute problems today.  Anemia - history.  Pt is no longer taking her iron supplement.  no reason just stopped taking it.  Spanish interpreter here with pt.  Allergies (verified): No Known Drug Allergies  Review of Systems General:  Denies fever. CV:  Complains of fatigue; denies chest pain or discomfort and palpitations. Resp:  Denies cough. GI:  Denies abdominal pain. GU:  Complains of decreased libido. Derm:  Complains of itching and rash; using baby lotion to face.  recurrent.  improves but then returns during the cold weather..  Physical Exam  General:  alert.   Head:  normocephalic.   Ears:  ear piercing(s) noted.   Lungs:  normal breath sounds.   Heart:  normal rate and regular rhythm.   Skin:  upper checks and bil area on nose with rash, red, flaky Psych:  Oriented X3.     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 One tablet by mouth daily  for thyroid  Orders: T-TSH ([address]109-60454) T-Free T4 (23300) T-T3, Free [phone])  Problem # 2:  NEED PROPHYLACTIC VACCINATION&INOCULATION FLU (ICD-V04.81) given today  Problem # 3:  FATIGUE (ICD-780.79) will check anemia panel today. pt is no longer taking iron supplement Orders: T- * Misc. Laboratory test [phone])  Complete Medication List: 1)  Ibuprofen 800 Mg Tabs (Ibuprofen) .Marland Kitchen.. 1 tablet by mouth two times a day as needed for pain 2)  Synthroid 150 Mcg Tabs (Levothyroxine sodium) .... One tablet by mouth daily  for thyroid 3)  Sedapap 50-650 Mg Tabs (Butalbital-acetaminophen) .... One tablet by mouth two times a day as needed for headache 4)  Loratadine 10 Mg Tabs (Loratadine) .Marland Kitchen.. 1 tablet by mouth daily for allergies 5)  Ferrous Sulfate 325 (65 Fe) Mg Tabs (Ferrous sulfate) .... Hold  Other Orders: Flu Vaccine 41yrs + [phone]) Admin 1st Vaccine (57846) Admin 1st Vaccine [facility]) [phone])  Patient Instructions: 1)  Your thyroid labs will be checked today. 2)  You have been given the flu vaccine today.  It will take 2 weeks for it to become effective. 3)  Wash your face with only water 4)  Dry the face 5)  Use [place] Facial moisterizers on the face. 6)  Schedule an appointment for a complete physical exam 7)  Do not eat after midnight  before this appointment for labs   Influenza Vaccine    Vaccine Type: Fluvax 3+    Site: left deltoid    Mfr: Sanofi Pasteur    Dose: 0.5 ml    Route: IM    Given by: Levon Hedger    Exp. Date: 12/11/2009    Lot #: [id number]    VIS given: 01/05/07 version given June 30, 2009.  Flu Vaccine Consent Questions    Do you have a history of severe allergic reactions to this vaccine? no    Any prior history of allergic  reactions to egg and/or gelatin? no    Do you have a sensitivity to the preservative Thimersol? no    Do you have a past history of Guillan-Barre Syndrome? no    Do you currently have an acute febrile illness? no    Have you ever had a severe reaction to latex? no    Vaccine information given and explained to patient? yes    Are you currently pregnant? no

## 2010-07-14 NOTE — Letter (Signed)
Summary: *HSN Results Follow up  [facility]  [address] [place], [place] [address]   Phone: [phone]  Fax: [phone]      07/01/2009   Judith Kelly [address] [place], [place]  [address]   Dear  [name],                            ____S.Drinkard,FNP   ____D. Gore,FNP       ____B. McPherson,MD   ____V. Rankins,MD    ____E. Mulberry,MD    _X___N. Daphine Deutscher, FNP  ____D. Reche Dixon, MD    ____K. Philipp Deputy, MD    ____Other     This letter is to inform you that your recent test(s):  _______Pap Smear    ___X____Lab Test     _______X-ray    ___X____ Thyroid is within acceptable limits.  Your refills have been sent to [facility].   Comments:  Your iron count is ok however another vitamin called ferritin is a little low.  You may consider taking a multivitami.  y.aily.  This can be purchased over the counter.       _________________________________________________________ If you have any questions, please contact our office [phone].                    Sincerely,    Lehman Prom FNP [facility]

## 2010-07-14 NOTE — Consult Note (Signed)
Summary: [facility]   Imported By: Bradly Bienenstock 01/26/2010 16:01:19  _____________________________________________________________________  External Attachment:    Type:   Image     Comment:   External Document

## 2010-07-14 NOTE — Progress Notes (Signed)
  Phone Note Call from Patient Call back at [phone]   Caller: Patient Summary of Call: Pt called to know the status of her medici please contact her at [phone]  Initial call taken by: Marines Jean Rosenthal,  May 05, 2010 8:53 AM  Follow-up for Phone Call        paged Dr. Edmonia James and she  will call patient. Follow-up by: Theresia Lo RN,  May 05, 2010 9:24 AM  Additional Follow-up for Phone Call Additional follow up Details #1::        Dr. Edmonia James states she tried calling patient and she did not pick up phone and had to leave a message to call back. Dr. Edmonia James states she has talked with pharmacist and there is a med that costs $280.00, one for $180.00 that works just as well as the [employer]. there is a cream for $30.00 that may not work as well. will have Marines call patient to ask which she wants . Theresia Lo RN  May 05, 2010 9:26 AM  Additional Follow-up by: Theresia Lo RN,  May 05, 2010 9:26 AM    Additional Follow-up for Phone Call Additional follow up Details #2::    please see other 05/05/10 phone note.   left VM for pt with information regarding new rx.  Ellin Mayhew MD  May 05, 2010 2:07 PM

## 2010-07-14 NOTE — Progress Notes (Signed)
  Phone Note Outgoing Call   Summary of Call: I have tried to call pt multiple times throughout month to review TSH results as she had requested.  Last TSH was  WNL.  Pt has not been home to answer phone call.  Left brief phone message. Encouraged her to call me if any further questions.  Ellin Mayhew MD  March 21, 2010 9:31 AM

## 2010-07-14 NOTE — Assessment & Plan Note (Signed)
Summary: Complete Physical Exam   Vital Signs:  Patient profile:   40 year old female Menstrual status:  regular LMP:     06/20/2009 Weight:      176.5 pounds BMI:     36.70 BSA:     1.73 Temp:     97.7 degrees F oral Pulse rate:   88 / minute Pulse rhythm:   regular Resp:     20 per minute BP sitting:   118 / 88  (left arm) Cuff size:   regular  Vitals Entered By: Levon Hedger (July 15, 2009 10:50 AM) CC: CPP...pt states she has a blister on her vagina Is Patient Diabetic? No Pain Assessment Patient in pain? yes     Location: vagina  Does patient need assistance? Functional Status Self care Ambulation Normal Comments pt states she is taking Synthroid LMP (date): 06/20/2009 LMP - Character: normal     Menstrual flow (days): 3 Menstrual Status regular Enter LMP: 06/20/2009 Last PAP Result NEGATIVE FOR INTRAEPITHELIAL LESIONS OR MALIGNANCY.   CC:  CPP...pt states she has a blister on her vagina.  History of Present Illness:  Pt into the office fo a complete physical exam  Mammogram - no family history of breast cancer never had a mammogram before no current problems with breast  PAP - all normal PAP smears Menese - monthly No family history of ovarian or cervical cancer Children - 3 children, natural births S/p tubal ligation  Optho - no glasses or contacts. No recent eye exams. NO curernt problems with eyes  Dental - Recent dental exam in the past year for cleaning.  tdap - up to date  Spanish interpreter present today with pt  Habits & Providers  Alcohol-Tobacco-Diet     Alcohol drinks/day: 0     Tobacco Status: never  Exercise-Depression-Behavior     Does Patient Exercise: no     Have you felt down or hopeless? no     Have you felt little pleasure in things? no     Drug Use: no     Seat Belt Use: 100     Sun Exposure: occasionally  Comments: PHQ-9 score = 8  Allergies (verified): No Known Drug Allergies  Social  History: Does Patient Exercise:  no  Review of Systems General:  Denies fever. Eyes:  Denies blurring. ENT:  Denies earache. CV:  Denies chest pain or discomfort. Resp:  Denies cough. GI:  Denies abdominal pain. GU:  Denies discharge; area in question - blister on vagina. MS:  Denies joint pain. Derm:  Complains of itching; red appearance to forehead and cheeks  that get worse when menses starts. Neuro:  Denies headaches. Psych:  Denies depression. Endo:  Denies excessive urination.  Physical Exam  General:  alert.  obese Head:  normocephalic.   Eyes:  pupils round.   Ears:  Bil TM with bony landmarks present Nose:  no nasal discharge.   Mouth:  fair dentition.   Neck:  supple.   Chest Wall:  no deformities.   Breasts:  skin/areolae normal, no masses, and no abnormal thickening.   Lungs:  no accessory muscle use and normal breath sounds.   Heart:  normal rate and regular rhythm.   Abdomen:  soft, non-tender, and normal bowel sounds.   Rectal:  no external abnormalities.   Msk:  normal ROM.   Pulses:  R radial normal and L radial normal.   Extremities:  no edema Neurologic:  alert & oriented X3.  Skin:  bil cheeks and forehead with erythema, flaky skin, venous pattern Psych:  Oriented X3.    Pelvic Exam  Vulva:      normal appearance.   Urethra and Bladder:      Urethra--normal.   Vagina:      physiologic discharge.  labia minor - left, cystic like lesion (round) Cervix:      midposition.   Adnexa:      nontender bilaterally.   Rectum:      normal.      Impression & Recommendations:  Problem # 1:  ROUTINE GYNECOLOGICAL EXAMINATION (ICD-V72.31)  PAP done Mammogram - deferred to age 2 labs done recommend optho and dental exam PGQ-9 score = 8 Orders: UA Dipstick w/o Micro (manual) (33295) Pap Smear, Thin Prep ( Collection of) [id number]) KOH/ WET Mount [phone]) Rapid HIV  [phone]) T-Lipid Profile [phone]) T-Comprehensive Metabolic Panel  ([id number]) T- GC Chlamydia (37628)  Problem # 2:  VAGINITIS, CANDIDAL (ICD-112.1) will treat for candidiasis cystic like lesion present  advised pt warm compreses  Her updated medication list for this problem includes:    Fluconazole 150 Mg Tabs (Fluconazole) ..... One tablet by mouth x 1 dose  Problem # 3:  ROSACEA (ICD-695.3) advsied pt of dx  Complete Medication List: 1)  Ibuprofen 800 Mg Tabs (Ibuprofen) .Marland Kitchen.. 1 tablet by mouth two times a day as needed for pain 2)  Synthroid 150 Mcg Tabs (Levothyroxine sodium) .... One tablet by mouth daily  for thyroid 3)  Sedapap 50-650 Mg Tabs (Butalbital-acetaminophen) .... One tablet by mouth two times a day as needed for headache 4)  Loratadine 10 Mg Tabs (Loratadine) .Marland Kitchen.. 1 tablet by mouth daily for allergies 5)  Ferrous Sulfate 325 (65 Fe) Mg Tabs (Ferrous sulfate) .... Hold 6)  Fluconazole 150 Mg Tabs (Fluconazole) .... One tablet by mouth x 1 dose 7)  Metronidazole 0.75 % Gel (Metronidazole) .... One application topically two times a day to affected area  Patient Instructions: 1)  The area in question is a cyst. 2)  Apply warm compresses so the area can drain. 3)  Also take diflucan for yeast - seen on wet prep 4)  Use metrogel cream to face two times a day.  Let sit for 5 minutes then rinse 5)  Follow up as needed Prescriptions: METRONIDAZOLE 0.75 % GEL (METRONIDAZOLE) One application topically two times a day to affected area  #45gm x 1   Entered and Authorized by:   Lehman Prom FNP   Signed by:   Lehman Prom FNP on 07/15/2009   Method used:   Print then Give to Patient   RxID:   [id number] FLUCONAZOLE 150 MG TABS (FLUCONAZOLE) One tablet by mouth x 1 dose  #1 x 0   Entered and Authorized by:   Lehman Prom FNP   Signed by:   Lehman Prom FNP on 07/15/2009   Method used:   Print then Give to Patient   RxID:   [id number]   Laboratory Results   Urine Tests  Date/Time Received: July 15, 2009  11:03 AM  Date/Time Reported: July 15, 2009 11:03 AM   Routine Urinalysis   Color: lt. yellow Appearance: Clear Glucose: negative   (Normal Range: Negative) Bilirubin: negative   (Normal Range: Negative) Ketone: negative   (Normal Range: Negative) Spec. Gravity: 1.020   (Normal Range: 1.003-1.035) Blood: trace-intact   (Normal Range: Negative) pH: 6.0   (Normal Range: 5.0-8.0) Protein: trace   (Normal Range: Negative) Urobilinogen: 0.2   (  Normal Range: 0-1) Nitrite: negative   (Normal Range: Negative) Leukocyte Esterace: negative   (Normal Range: Negative)    Date/Time Received: July 16, 2009 12:55 PM   Wet Mount/KOH Source: vaginal WBC/hpf: 1-5 Bacteria/hpf: rare Clue cells/hpf: none Yeast/hpf: moderate Trichomonas/hpf: none    Prevention & Chronic Care Immunizations   Influenza vaccine: Fluvax 3+  (06/30/2009)    Tetanus booster: 07/31/2004: Historical    Pneumococcal vaccine: Not documented  Other Screening   Pap smear: NEGATIVE FOR INTRAEPITHELIAL LESIONS OR MALIGNANCY.  (03/14/2008)   Pap smear action/deferral: Ordered  (07/15/2009)   Pap smear due: 07/15/2010   Smoking status: never  (07/15/2009)  Lipids   Total Cholesterol: 207  (04/05/2007)   Lipid panel action/deferral: Lipid Panel ordered   LDL: 107  (04/05/2007)   LDL Direct: Not documented   HDL: 58  (04/05/2007)   Triglycerides: 209  (04/05/2007)   Nursing Instructions: Pap smear today     Laboratory Results   Urine Tests    Routine Urinalysis   Color: lt. yellow Appearance: Clear Glucose: negative   (Normal Range: Negative) Bilirubin: negative   (Normal Range: Negative) Ketone: negative   (Normal Range: Negative) Spec. Gravity: 1.020   (Normal Range: 1.003-1.035) Blood: trace-intact   (Normal Range: Negative) pH: 6.0   (Normal Range: 5.0-8.0) Protein: trace   (Normal Range: Negative) Urobilinogen: 0.2   (Normal Range: 0-1) Nitrite: negative   (Normal Range:  Negative) Leukocyte Esterace: negative   (Normal Range: Negative)      Wet Mount Wet Mount KOH: Negative

## 2010-07-14 NOTE — Assessment & Plan Note (Signed)
Summary: f/u HTN/dizziness eo   Vital Signs:  Patient profile:   40 year old female Menstrual status:  regular Weight:      186.7 pounds BMI:     38.83 Temp:     97.9 degrees F oral Pulse rate:   68 / minute Pulse (ortho):   77 / minute BP sitting:   143 / 95  (right arm) BP standing:   136 / 92 Cuff size:   regular  Vitals Entered By: Jimmy Footman, CMA (December 10, 2009 8:50 AM)  Serial Vital Signs/Assessments:  Time      Position  BP       Pulse  Resp  Temp     By 9:18 AM   Lying RA  137/93   61                    Robert Busick CMA 9:18 AM   Sitting   138/94   72                    Tessie Fass CMA 9:18 AM   Standing  136/92   77                    Tessie Fass CMA           Sitting   138/90                         Eustaquio Boyden  MD  CC: F/U, dizziness Pain Assessment Patient in pain? yes     Location: head Comments patient states she is having head pain   CC:  F/U and dizziness.  History of Present Illness: CC: f/u dizziness  1. dizziness - improved positional vertigo after epley maneuver.  However continues with imbalance when standing (Monday when awoke from sleep), associated with nausea and headache x1, parietal/occipital region of head (not like previous migraines).  Took norvasc 5mg  (prescribed from [facility]), then got headache. + ringing in ears (intermittent).  No hearing loss  Still working on [employer].    Habits & Providers  Alcohol-Tobacco-Diet     Tobacco Status: never  Allergies: No Known Drug Allergies  Past History:  Past medical, surgical, family and social histories (including risk factors) reviewed for relevance to current acute and chronic problems.  Past Medical History: Reviewed history from 11/18/2009 and no changes required. 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 none  Family History: Reviewed history from 11/18/2009 and no changes required. HTN - mother enlarged heart - mother MI - grandfathers (older) DM - grandmother Colon CA - uncle (unsure age, ?60s) No MI, CVA  Social History: Reviewed history from 11/18/2009 and no changes required. No smoking, EtOH, rec drugs.  Lives in [place], with husband and 3 children.  2 dogs outside.  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  Review of Systems       per HPI  Physical Exam  General:  alert.  obese Lungs:  Normal respiratory effort, chest expands symmetrically. Lungs are clear to auscultation, no crackles or wheezes. Heart:  normal s1, S2, no m/r/g Extremities:  no edema Neurologic:  last visit: alert & oriented X3.  CN intact.  when doing dix hallpike, + multiple beats of horizontal nystagmus when left ear tested.   Impression & Recommendations:  Problem #  1:  BENIGN PAROXYSMAL POSITIONAL VERTIGO (ICD-386.11) not consistent with Meniere's, not consistent with labrynthitis (no recent viral illness).  Orthostatics normal today.  pt endorses vertigo AND imbalance.  Will refer to PT for BPPV and balance training once pt attains debra hill.  To call us when this is done, then we will refer.  Order in chart.  Her updated medication list for this problem includes:    Loratadine 10 Mg Tabs (Loratadine) .Marland Kitchen... 1 tablet by mouth daily for allergies    Antivert 25 Mg Tabs (Meclizine hcl) .Marland Kitchen... 1/2 tablet as needed dizziness  Orders: Physical Therapy Referral (PT) [facility]- Est Level  3 ([address])  Problem # 2:  ESSENTIAL HYPERTENSION, BENIGN (ICD-401.1)  Remains elevated.  Start HCTZ.  RTC 1 mo for f/u.  will check EKG, basic blood work then (BMP, TSH) as currently without insurance.  advised to buy BP cuff and check BP a few times a week and bring log next visit.  Her updated medication list for this problem includes:     Hydrochlorothiazide 12.5 Mg Caps (Hydrochlorothiazide) .Marland Kitchen... Take one by mouth daily for blood pressure  BP today: 143/95 Prior BP: 135/88 (11/18/2009)  Labs Reviewed: K+: 4.4 (07/15/2009) Creat: : 0.63 (07/15/2009)   Chol: 180 (07/15/2009)   HDL: 56 (07/15/2009)   LDL: 107 (07/15/2009)   TG: 86 (07/15/2009)  Orders: [facility]- Est Level  3 (60454)  Complete Medication List: 1)  Excedrin Migraine 250-250-65 Mg Tabs (Aspirin-acetaminophen-caffeine) .... As needed migraines 2)  Synthroid 150 Mcg Tabs (Levothyroxine sodium) .... One tablet by mouth daily  for thyroid 3)  Loratadine 10 Mg Tabs (Loratadine) .Marland Kitchen.. 1 tablet by mouth daily for allergies 4)  Ferrous Sulfate 325 (65 Fe) Mg Tabs (Ferrous sulfate) .... Hold (stopped early 2011) 5)  Antivert 25 Mg Tabs (Meclizine hcl) .... 1/2 tablet as needed dizziness 6)  Hydrochlorothiazide 12.5 Mg Caps (Hydrochlorothiazide) .... Take one by mouth daily for blood pressure  Patient Instructions: 1)  Return in 1 month for f/u. 2)  Comprar un blood pressure cuff y tomarse la presion varias veces a la semana (2-3).  Mantener un log de presiones y traerla para su proxima visita. 3)  Comenzar Hydrochlorothiazide 12.5 mg al dia para presion (y no tomar mas norvasc). 4)  Regresar en 1 mes para visita para ver como sigue la presion 5)  (cuando tenga [place], llamenos y le haremos referencia a terapia fisica de vertigo). Prescriptions: HYDROCHLOROTHIAZIDE 12.5 MG CAPS (HYDROCHLOROTHIAZIDE) take one by mouth daily for blood pressure  #31 x 3   Entered and Authorized by:   Eustaquio Boyden  MD   Signed by:   Eustaquio Boyden  MD on 12/10/2009   Method used:   Electronically to        [facility] (retail)       [address]       [place], [place]  [address]       Ph: [phone]       Fax: [phone]   RxID:   [id number]

## 2010-07-14 NOTE — Assessment & Plan Note (Signed)
Summary: NP TO SEE SYKES AT 11:00AM/KH   Vital Signs:  Patient profile:   40 year old female Menstrual status:  regular Height:      58.25 inches Weight:      187.4 pounds BMI:     38.97  Vitals Entered By: Wyona Almas PHD (March 13, 2010 11:00 AM)  History of Present Illness: Assessment:  Spent min 60 with pt, along with interpretor Morenas.  Usual eating pattern includes 3 meals and occasional snacks.  Everyday foods/beverages include eggs, sandwich, beans, rice, meat, milk, coffee, juice, tortillas, bread. 24-hr recall suggests intake of  ~1600 kcal: B (8 AM)- 1 arepa (corn bread) w/ ham & mozzarella, coffee w/ Splenda & 1 c 2% milk; Snk- water; L (2 PM)- chicken sandwich w/ let,  ~ 1 1/2 tsp mayo, cheese pastry, 12 oz soda; Snk- water; D (7 PM)-  ~5 c chicken soup w/ potatoes, carrots, grn beans, celery, cauliflower, 2 corn tortillas.  Maurisha said she often has tortillas with dinner, in addition to rice or other starch foods.  She does 45 min Zumba class 3 X wk, and sometmes walks 15 min before class.  Desirea is taking phentermine, which she says has helped curb her appetite.  I encouraged her to make sure she does not skip meals, however, which is sometimes easy to do on that medication.    Nutrition Diagnosis:  Excessive energy intake (NI-1.5) related to expenditure as evidenced by BMI of >38.  Physical inactivity (NB-2.1) related to intake as evidenced by exercise limited to 45-60 min 3 X wk.    Intervention: See Patient Instructions.    Monitoring/Eval: Dietary intake, body weight, and exercise at 4-wk F/U.     Allergies: No Known Drug Allergies   Complete Medication List: 1)  Excedrin Migraine 250-250-65 Mg Tabs (Aspirin-acetaminophen-caffeine) .... As needed migraines 2)  Synthroid 150 Mcg Tabs (Levothyroxine sodium) .... One tablet by mouth daily  for thyroid 3)  Loratadine 10 Mg Tabs (Loratadine) .Marland Kitchen.. 1 tablet by mouth daily for allergies 4)  Ferrous Sulfate 325 (65  Fe) Mg Tabs (Ferrous sulfate) .... Hold (stopped early 2011) 5)  Antivert 25 Mg Tabs (Meclizine hcl) .... 1/2 tablet as needed dizziness 6)  Hydrochlorothiazide 12.5 Mg Caps (Hydrochlorothiazide) .... Take one by mouth daily for blood pressure 7)  Phentermine Hcl 15 Mg Caps (Phentermine hcl) .... Usure of dosage  Other Orders: Inital Assessment Each - [facility] [phone])  Patient Instructions: 1)  Eat at least 3 meals and 1-2 snacks per day.  No more than 5 hours between eating.   2)  Obtain twice as many veg's as protein or carbohydrate foods for both lunch and dinner.  3)  Fruits and vegetables are good sources of potassium, which is important for helping to control your blood pressure (along with keeping sodium intake low).   4)  When you eat out, be sure to make the lowest fat choices you can, and always try to get vegetables with your meal.   5)  Walk at least 30 minutes 2 X wk in addition to your Zumba classes.   6)  Also look for exercise opportunities in your everyday activities, such as taking the stairs in stead of the elevator.   7)  Keep a food and exercise record, writing down everything you eat and drink each day, including what time you eat, and how many minutes of exercise you get.   8)  Please schedule a follow-up appointment for October.

## 2010-07-14 NOTE — Progress Notes (Signed)
Summary: RX  Phone Note Outgoing Call Call back at [phone]   Summary of Call: I called Pt and let her know that it is a  different option for her medc and, Dr. Edmonia James was  calling  her in different oportunities but pt doens't answer phone. Pt stated she wants the prescription for the medic of $180 because she wants to ask the price in others pharm.  Initial call taken by: Marines Jean Rosenthal,  May 05, 2010 10:21 AM  Follow-up for Phone Call        Have routed to Dr. Edmonia James. told patient will call her when Dr. Edmonia James has written RX . patient wants it written so she can check around at different pharmacies. Follow-up by: Theresia Lo RN,  May 05, 2010 10:37 AM  Additional Follow-up for Phone Call Additional follow up Details #1::        I called pt and left voicemail that I have sent another prescription to the pharmacy.  After further discussion with the pharmacist I learned that I can prescribe the same medication- metronidazole at a lower dosage 0.75% instead of 1%.  and she will need to simply apply this 2 x per day instead of once a day.  This simple change in dosage and frequency will lower the cost of the medication to less than $50.  Called and left voice mail with this information. Gave name of medication in case she would like to call the other pharmacies for pricing.  Went ahead and sent Rx to [facility] [address] in case she would like to go ahead and pick up.   Instructed pt to call me back if any questions.  Ellin Mayhew MD  May 05, 2010 2:04 PM     New/Updated Medications: METRONIDAZOLE 0.75 % GEL (METRONIDAZOLE) apply to affected areas on face two times a day Prescriptions: METRONIDAZOLE 0.75 % GEL (METRONIDAZOLE) apply to affected areas on face two times a day  #1 x 3   Entered and Authorized by:   Ellin Mayhew MD   Signed by:   Ellin Mayhew MD on 05/05/2010   Method used:   Electronically to        [facility] (retail)       [address]       [place], [place]  [address]       Ph: [phone]       Fax: [phone]   RxID:   [id number]

## 2010-07-14 NOTE — Letter (Signed)
Summary: Handout Printed  Printed Handout:  - Rosacea

## 2010-07-14 NOTE — Assessment & Plan Note (Signed)
Summary: bp/obesity   Vital Signs:  Patient profile:   40 year old female Menstrual status:  regular Height:      58.25 inches Weight:      189 pounds BMI:     39.30 Pulse rate:   88 / minute BP sitting:   125 / 87  (right arm) Cuff size:   regular  Vitals Entered By: Tessie Fass CMA (February 11, 2010 2:43 PM)  CC: F/U BP, Hypertension Management, Headache   CC:  F/U BP, Hypertension Management, and Headache.  History of Present Illness: dizziness/ vertigo: Pt states that this is well controlled.  She states that this is much improved and is currently not an issue anymore.   BP: Well controlled today 125/87.  Pt denies any problems or side effects with medications.  No le edema.  No h/a.    decreased libido: Pt states that she has very little interest in sex and that this is impacting her relationship with her husband.  She states she wants to have sexual relations with her husband in her mind but she has a hard time getting phycially interested.  Has spoken with providers in past about this issue.  Has had problems with this for years.  has tried different lubrications.  Had providers mention use of hormone, testosterone therapy, in past but pt did not use these medications 2/2 concern for side effects.  Pt states that she has a "good" relationship with her spouse and that this relationship is not the cause of her decreased libido.     Obesity: Pt has been seen at outside provider to recieve weight loss pills.  Phentermine tablets given by this provider.  Pt states that she has taken nutrition classes at [facility] and she is trying to increase exercise yet still she has not had any weight loss therefore she is trying to use these new tablets.  She endorses that she has had weight problems her whole life and that it has been worse since she has had problems with her thyroid.    Headache HPI:      The headaches will last anywhere from 2 hours to 3 days at a time.  She has  approximately 3 headaches per month.  Headaches have been occurring since age 10.  The patient is right handed.        The location of the headaches are unilateral-right.  Headache quality is pressure or tightness.  The headaches are associated with nausea.         Hypertension History:      She denies headache, chest pain, palpitations, dyspnea with exertion, orthopnea, PND, peripheral edema, visual symptoms, neurologic problems, syncope, and side effects from treatment.        Positive major cardiovascular risk factors include hypertension.  Negative major cardiovascular risk factors include female age less than 55 years old and non-tobacco-user status.    Current Medications (verified): 1)  Excedrin Migraine 250-250-65 Mg Tabs (Aspirin-Acetaminophen-Caffeine) .... As Needed Migraines 2)  Synthroid 150 Mcg Tabs (Levothyroxine Sodium) .... One Tablet By Mouth Daily  For Thyroid 3)  Loratadine 10 Mg Tabs (Loratadine) .Marland Kitchen.. 1 Tablet By Mouth Daily For Allergies 4)  Ferrous Sulfate 325 (65 Fe) Mg Tabs (Ferrous Sulfate) .... Hold (Stopped Early 2011) 5)  Antivert 25 Mg Tabs (Meclizine Hcl) .... 1/2 Tablet As Needed Dizziness 6)  Hydrochlorothiazide 12.5 Mg Caps (Hydrochlorothiazide) .... Take One By Mouth Daily For Blood Pressure  Allergies (verified): No Known Drug Allergies  Review of  Systems       as per hpi  Physical Exam  General:  Well-developed,well-nourished,in no acute distress; alert,appropriate and cooperative throughout examination Neck:  no masses Lungs:  Normal respiratory effort, chest expands symmetrically. Lungs are clear to auscultation, no crackles or wheezes. Heart:  Normal rate and regular rhythm. S1 and S2 normal without gallop, murmur, click, rub or other extra sounds. Abdomen:  Bowel sounds positive,abdomen soft and non-tender Extremities:  no edema Psych:  Cognition and judgment appear intact. Alert and cooperative with normal attention span and concentration. No  apparent delusions, illusions, hallucinations   Impression & Recommendations:  Problem # 1:  OBESITY NOS (ICD-278.00) Assessment Deteriorated discussed the down side to medications for weight loss- evidence shows that after stopping the medication that pt's regain all of weight and sometimes more weight.  Also pt aware that these medications have side effects.  We discussed the basics of nutrition and exercise.  Pt has taken nutrition classes at [facility].  But i encouraged her to make an appt with our nutritionist to adress the specific barriers that she finds to actually carrying out these nutrition changes that she knows she should make.  RD will be able to help her assess her dietary intake and make plan for her to apply the changes while addressing the potential barriers she may encounter.  Family seems to be resistent to nutrition changes.  Pt is working on developing exercise plan- walking.  Will calculate pt goal weight at next appt. and set weight goals. Pt to f/up with nutritionist then return to see me after her appt with RD.    Orders: Nutrition Referral (Nutrition)  Problem # 2:  ESSENTIAL HYPERTENSION, BENIGN (ICD-401.1) BP well controlled will continue current regimen.  Her updated medication list for this problem includes:    Hydrochlorothiazide 12.5 Mg Caps (Hydrochlorothiazide) .Marland Kitchen... 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  Will reassure pt that TSH is wnl at next appt.  If she desires we can recheck it to confirm that it continues to be within normal limits since it has been > than 6 months since last check.   Will discuss at follow up appt.  Her updated medication list for this problem includes:    Synthroid 150 Mcg Tabs (Levothyroxine sodium) ..... One tablet by mouth daily  for thyroid  Problem # 4:  DECREASED SEXUAL DESIRE  (ICD-799.81) Encouraged pt to continue with lubrication.  After review of the literature there continues to be concern about unknown side effects of testosterone use to treat premenopausal women with decreased sexual desire.  These were the concerns that pt had when she sought out a providers help with this issue a few years back.  At next appt will see if patient may be open to talking with a counselor about this issue.   Problem # 5:  Preventive Health Care (ICD-V70.0) Will review PCMH form at next appt.  Need to discuss mammogram screening with patient at next appt.   Complete Medication List: 1)  Excedrin Migraine 250-250-65 Mg Tabs (Aspirin-acetaminophen-caffeine) .... As needed migraines 2)  Synthroid 150 Mcg Tabs (Levothyroxine sodium) .... One tablet by mouth daily  for thyroid 3)  Loratadine 10 Mg Tabs (Loratadine) .Marland Kitchen.. 1 tablet by mouth daily for allergies 4)  Ferrous Sulfate 325 (65 Fe) Mg Tabs (Ferrous sulfate) .... Hold (stopped early 2011) 5)  Antivert 25 Mg Tabs (  Meclizine hcl) .... 1/2 tablet as needed dizziness 6)  Hydrochlorothiazide 12.5 Mg Caps (Hydrochlorothiazide) .... Take one by mouth daily for blood pressure 7)  Phentermine Hcl 15 Mg Caps (Phentermine hcl) .... Usure of dosage  Hypertension Assessment/Plan:      The patient's hypertensive risk group is category A: No risk factors and no target organ damage.  Her calculated 10 year risk of coronary heart disease is 2 %.  Today's blood pressure is 125/87.  Her blood pressure goal is < 140/90.    Patient Instructions: 1)  make an appt for in 3 weeks or after your appt with the nutritionist. 2)  make an appt to meet with the nutritionist. 3)  Siga trabajando en el nutritcion y en el ejercicio.   Prevention & Chronic Care Immunizations   Influenza vaccine: Fluvax 3+  (06/30/2009)    Tetanus booster: 07/31/2004: Historical    Pneumococcal vaccine: Not documented  Other Screening   Pap smear:  Specimen Adequacy:  Satisfactory for evaluation.   Interpretation/Result:Negative for intraepithelial Lesion or Malignancy.     (07/15/2009)   Pap smear action/deferral: Ordered  (07/15/2009)   Pap smear due: 07/2010    Mammogram: Not documented   Smoking status: never  (01/08/2010)  Lipids   Total Cholesterol: 180  (07/15/2009)   Lipid panel action/deferral: Lipid Panel ordered   LDL: 107  (07/15/2009)   LDL Direct: Not documented   HDL: 56  (07/15/2009)   Triglycerides: 86  (07/15/2009)  Hypertension   Last Blood Pressure: 125 / 87  (02/11/2010)   Serum creatinine: 0.63  (07/15/2009)   Serum potassium 4.4  (07/15/2009)    Hypertension flowsheet reviewed?: Yes   Progress toward BP goal: At goal  Self-Management Support :   Referred.    Hypertension self-management support: Not documented

## 2010-07-14 NOTE — Letter (Signed)
Summary: *HSN Results Follow up  [facility]  [address] [place], [place] [address]   Phone: [phone]  Fax: [phone]      07/16/2009   QUINLYN TEP [address] [place], [place]  [address]   Dear  [name],                            ____S.Drinkard,FNP   ____D. Gore,FNP       ____B. McPherson,MD   ____V. Rankins,MD    ____E. Mulberry,MD    __X__N. Daphine Deutscher, FNP  ____D. Reche Dixon, MD    ____K. Philipp Deputy, MD    ____Other     This letter is to inform you that your recent test(s):  ___X____Pap Smear    ____X___Lab Test     _______X-ray    ___X____ is within acceptable limits  _______ requires a medication change  _______ requires a follow-up lab visit  _______ requires a follow-up visit with your provider   Comments: Labs done during recent office visit ok.  Pap smear results ____________________________.       _________________________________________________________ If you have any questions, please contact our office [phone].                    Sincerely,    Lehman Prom FNP [facility]

## 2010-07-14 NOTE — Assessment & Plan Note (Signed)
Summary: bp/rosasea/bppv   Vital Signs:  Patient profile:   40 year old female Menstrual status:  regular Height:      58.25 inches Weight:      190 pounds BMI:     39.51 Temp:     98.0 degrees F oral Pulse rate:   80 / minute BP sitting:   141 / 83  (left arm) Cuff size:   regular  Vitals Entered By: Jimmy Footman, CMA (January 08, 2010 3:57 PM) CC: htn f/u Is Patient Diabetic? No Pain Assessment Patient in pain? no        CC:  htn f/u.  History of Present Illness: follow up HTN: Pt states that she has stopped taking HCTZ.  She was concerned that this was contributing to her dizziness.  Has been checking bp at home.  Results in 120's-130's systolic and 80-90's diastolic consistently.   A bit higher at today's appt.  141/83.  She said that after she stopped taking the medication that her dizziness did improve.  She said she doesn't know if this is just coincidence.  No edema. NO CP. No SOB  Face rash: pt has hx of rosacea.  Pt repots facial dryness and requests a recommendation for locion for dry skin.   follow up BPPV: pt states that she is much improved since she has gone to therapy.  She states that she has no dizziness now.  The only exception is that occasionally when she lies down on her right side that she has some minimal dizziness, but she reports that it is greatly improved.  No sensation of imbalance.   no weakness, no changes in sensation.  Habits & Providers  Alcohol-Tobacco-Diet     Alcohol drinks/day: 0     Tobacco Status: never  Current Medications (verified): 1)  Excedrin Migraine 250-250-65 Mg Tabs (Aspirin-Acetaminophen-Caffeine) .... As Needed Migraines 2)  Synthroid 150 Mcg Tabs (Levothyroxine Sodium) .... One Tablet By Mouth Daily  For Thyroid 3)  Loratadine 10 Mg Tabs (Loratadine) .Marland Kitchen.. 1 Tablet By Mouth Daily For Allergies 4)  Ferrous Sulfate 325 (65 Fe) Mg Tabs (Ferrous Sulfate) .... Hold (Stopped Early 2011) 5)  Antivert 25 Mg Tabs (Meclizine Hcl)  .... 1/2 Tablet As Needed Dizziness 6)  Hydrochlorothiazide 12.5 Mg Caps (Hydrochlorothiazide) .... Take One By Mouth Daily For Blood Pressure  Allergies (verified): No Known Drug Allergies  Review of Systems       as per hpi  Physical Exam  General:  VSS Well-developed,well-nourished,in no acute distress; alert,appropriate and cooperative throughout examination Lungs:  Normal respiratory effort, chest expands symmetrically. Lungs are clear to auscultation, no crackles or wheezes. Heart:  Normal rate and regular rhythm. S1 and S2 normal without gallop, murmur, click, rub or other extra sounds. Extremities:  no edema Neurologic:  stregth equal bilateral in extremities.  sensation equal bilateral.  reflexes equal bilateral.  CN exam grossly intact.  Gilberto Better did not illicit any dizziness nor was any nystagmus seen with dix hallpike.   Skin:  red maculopapular rash on forehead, cheeks, and nose bilateral- consistent with previous diagosis of rosasea.   Psych:  Cognition and judgment appear intact. Alert and cooperative with normal attention span and concentration. No apparent delusions, illusions, hallucinations   Impression & Recommendations:  Problem # 1:  ESSENTIAL HYPERTENSION, BENIGN (ICD-401.1) Pt has stopped medication but home log shows bp wnl.  Will hold on by mouth medications for now.  Pt aware that we may need to restart if any elevations  in bp in future.  Encouraged to eat healthy diet and lose weight to lower bp as well.   Her updated medication list for this problem includes:    Hydrochlorothiazide 12.5 Mg Caps (Hydrochlorothiazide) .Marland Kitchen... Take one by mouth daily for blood pressure  Orders: [facility]- Est  Level 4 (47829) Nutrition Referral (Nutrition)  Problem # 2:  OBESITY NOS (ICD-278.00) Pt encouraged to eat healthy and exercise.  Pt will be referred to nutritionist for guidance.    Orders: [facility]- Est  Level 4 (56213) Nutrition Referral (Nutrition)  Problem # 3:   BENIGN PAROXYSMAL POSITIONAL VERTIGO (ICD-386.11) Pt improved.  Reviewed old MRI scans that shows some baseline anatomy variation.  Will have low threshold to repeat MRI if these symptoms return.  Yet at this time the likelihood of central pathology is low since symptoms have dramatically improved with PT.  Will follow pt closely.  Pt given red flags for return.  Her updated medication list for this problem includes:    Loratadine 10 Mg Tabs (Loratadine) .Marland Kitchen... 1 tablet by mouth daily for allergies    Antivert 25 Mg Tabs (Meclizine hcl) .Marland Kitchen... 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  Recommended purpose lotion since it is less irritating and not oil based.  Pt to return if dryness doesn't improve.    Orders: [facility]- Est  Level 4 (08657)  Problem # 5:  Preventive Health Care (ICD-V70.0) Pt is to return within 1 month.  Will need to review PCMH form at her next appt.    Complete Medication List: 1)  Excedrin Migraine 250-250-65 Mg Tabs (Aspirin-acetaminophen-caffeine) .... As needed migraines 2)  Synthroid 150 Mcg Tabs (Levothyroxine sodium) .... One tablet by mouth daily  for thyroid 3)  Loratadine 10 Mg Tabs (Loratadine) .Marland Kitchen.. 1 tablet by mouth daily for allergies 4)  Ferrous Sulfate 325 (65 Fe) Mg Tabs (Ferrous sulfate) .... Hold (stopped early 2011) 5)  Antivert 25 Mg Tabs (Meclizine hcl) .... 1/2 tablet as needed dizziness 6)  Hydrochlorothiazide 12.5 Mg Caps (Hydrochlorothiazide) .... Take one by mouth daily for blood pressure  Patient Instructions: 1)  regresa en 1 mes o mas pronto si necesita 2)  Si los simtomas de [employer] regresa llama la clinic y fija una cita 3)  trata de usar el locion se llama- purpose-- para el piel seca en la cara.  Si no mejora regresa.

## 2010-07-14 NOTE — Consult Note (Signed)
Summary: [facility]   Imported By: Bradly Bienenstock 01/14/2010 13:08:35  _____________________________________________________________________  External Attachment:    Type:   Image     Comment:   External Document

## 2010-07-14 NOTE — Miscellaneous (Signed)
  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

## 2010-07-21 ENCOUNTER — Encounter: Payer: Self-pay | Admitting: *Deleted

## 2010-10-22 ENCOUNTER — Ambulatory Visit (INDEPENDENT_AMBULATORY_CARE_PROVIDER_SITE_OTHER): Payer: Self-pay | Admitting: Family Medicine

## 2010-10-22 ENCOUNTER — Encounter: Payer: Self-pay | Admitting: Family Medicine

## 2010-10-22 DIAGNOSIS — I10 Essential (primary) hypertension: Secondary | ICD-10-CM

## 2010-10-22 DIAGNOSIS — G43909 Migraine, unspecified, not intractable, without status migrainosus: Secondary | ICD-10-CM

## 2010-10-22 DIAGNOSIS — R42 Dizziness and giddiness: Secondary | ICD-10-CM

## 2010-10-22 DIAGNOSIS — Z7189 Other specified counseling: Secondary | ICD-10-CM | POA: Insufficient documentation

## 2010-10-22 MED ORDER — SUMATRIPTAN SUCCINATE 50 MG PO TABS: 50.0000 mg | ORAL_TABLET | ORAL | Status: DC | PRN

## 2010-10-22 NOTE — Progress Notes (Signed)
  Subjective:    Patient ID: Judith Kelly, female    DOB: [birth year], 40 y.o.   MRN: [id number]  HPI Dizziness: X 2 months. Feels like room is spinning, happens daily when trying to lie back, when standing on tippy toes and when getting up quickly.  Has not been doing these exercises consistently..  No toma mas de la medicamiento - phentermine.  Este medicamento tiene muchas effectos secondarios- una es [place].       H/A:   1-3 x per week-  Pain always in right side of head, behind right eye, and down right side of neck.  Sometimes has N/V.  No always. No light sensitivity.  Sensitive to loud noises.    Bother has hx of migraines.   Review of Systems No fever. + occasional allergies (seasonal).  No n/v/d.  No body weakness.     Objective:   Physical Exam        Assessment & Plan:

## 2010-10-22 NOTE — Assessment & Plan Note (Signed)
Pt symptoms are consistent with diagnosis of migraines- has frequent problems with this, has sibling with dx of migraines.  Will try sumatriptan therapy pt to return if no improvement with this medication over the next month.

## 2010-10-22 NOTE — Assessment & Plan Note (Signed)
May be reoccurance of BPPV.  Or may be side effect of phentermine.  Pt to stop phentermine to see if this alleviates symptoms.  Pt also to increase fluid intake.  Pt to return if no improvement with the measures over the next 2 weeks.

## 2010-10-22 NOTE — Assessment & Plan Note (Signed)
Elevated today will recheck at next appt. In 1 month

## 2010-10-22 NOTE — Patient Instructions (Signed)
No toma mas de la medicamiento - phentermine. Este medicamento tiene muchas effectos secondarios- una es mareos [place] mucha aqua. Use el medicamiento- sumatriptan en las primeras etapas del dolor de [place].  Si no alieviarse puede despuese de 2 horas puede tomar un segundo dose. Necesita hacer los ejercicios 2-3 x al dia para el vertigo.

## 2010-10-22 NOTE — Progress Notes (Signed)
  Subjective:    Patient ID: Judith Kelly, female    DOB: [birth year], 40 y.o.   MRN: [id number]  HPI  Dizziness: Has started to have dizziness at times when laying down flat, sometimes when on tip toes reaching for something.  Most frequently when standing up quickly.  Is still taking phetermine tablets a few times per week.  States she knows that she doesn't drink a lot of fluids.  Pt states that this feels very similar to the vertigo that she has had in the past.  She has not tried doing the PT exercises that she learned during her last flare of vertigo.  H/a: Continues to have h/a- 2-3 x per week.  Unilateral- last 4 hours usually, but sometimes all day.  Sometimes has n/v.  Sensitive to loud noises.  No light sensitivity.  H/a usually on right side of head.  And pain down right neck and also behind right eye.  Pulsating, sharp in quality.    BP: elevated today at 152/96.  FH: sibling with migraines.    Review of Systems No syncope. No palpitations.  No changes in strength,  No one sided weakness.  No changes in vision.    Objective:   Physical Exam  Constitutional: She is oriented to person, place, and time. She appears well-developed.  HENT:  Head: Normocephalic.  Eyes: Pupils are equal, round, and reactive to light.  Neck: Normal range of motion.  Cardiovascular: Normal rate and regular rhythm.  Exam reveals no gallop and no friction rub.   No murmur heard. Pulmonary/Chest: Effort normal and breath sounds normal. No respiratory distress. She has no wheezes.  Abdominal: Soft. She exhibits no distension. There is no tenderness. There is no rebound.  Musculoskeletal: Normal range of motion. She exhibits no edema.  Neurological: She is alert and oriented to person, place, and time. No cranial nerve deficit. Coordination normal.        CN II- XII grossly intact  Skin: No rash noted.  Psychiatric: She has a normal mood and affect. Her behavior is normal. Judgment and thought  content normal.          Assessment & Plan:

## 2010-10-22 NOTE — Assessment & Plan Note (Signed)
Pt needs TSH, BmeT and lipid panel.  Will review needed health maintenance screenings at f/up appt in 1 month.

## 2010-10-29 ENCOUNTER — Other Ambulatory Visit: Payer: Self-pay

## 2010-10-29 DIAGNOSIS — Z7189 Other specified counseling: Secondary | ICD-10-CM

## 2010-10-29 LAB — LIPID PANEL
Cholesterol: 180 mg/dL (ref 0–200)
LDL Cholesterol: 109 mg/dL — ABNORMAL HIGH (ref 0–99)
Total CHOL/HDL Ratio: 3.3 Ratio
VLDL: 16 mg/dL (ref 0–40)

## 2010-10-29 LAB — BASIC METABOLIC PANEL
BUN: 21 mg/dL (ref 6–23)
CO2: 25 mEq/L (ref 19–32)
Chloride: 106 mEq/L (ref 96–112)
Glucose, Bld: 94 mg/dL (ref 70–99)
Potassium: 4.8 mEq/L (ref 3.5–5.3)
Sodium: 140 mEq/L (ref 135–145)

## 2010-10-29 NOTE — Progress Notes (Signed)
TSH,BMP AND FLP DONE TODAY Judith Kelly

## 2010-10-30 NOTE — H&P (Signed)
Judith Kelly, CHAVIS NO.:  [id number]   MEDICAL RECORD NO.:  [id number]          PATIENT TYPE:  EMS   LOCATION:  MAJO                         FACILITY:  [facility]   PHYSICIAN:  Hollice Espy, M.D.DATE OF BIRTH:  23-Aug-1970   DATE OF ADMISSION:  06/25/2004  DATE OF DISCHARGE:                                HISTORY & PHYSICAL   ATTENDING PHYSICIAN:  Corinna L. Lendell Caprice, M.D.   PRIMARY CARE PHYSICIAN:  Dineen Kid. Reche Dixon, M.D. at [facility].   CHIEF COMPLAINT:  Shortness of breath.   HISTORY OF PRESENT ILLNESS:  The patient is a 40 year old Hispanic female  who speaks only a few words of English and her history and physical were  obtained by myself with the assistance of a Spanish interpreter.  The  patient is a 40 year old Hispanic female who was recently diagnosed in the  past few months with hyperthyroidism.  Specifically, according to the note  by Dr. Reche Dixon, it is suspected that she has Graves disease.  In talking to  her, she has had palpitations since May, but was seen on June 19, 2004,  for follow-up for hyperthyroidism.  At that time, a thyroid scan showed a  diffuse uptake.  The patient apparently had symptoms of persistent tremor  and some palpitations.  She had been started on Methimazole and Atenolol.  She returned back after feeling short of breath today and at that time she  was noted to be in rapid atrial fibrillation.  She was given a 20 mg bolus  of Cardizem and sent over to the emergency room for further treatment and  evaluation.  In the emergency room, the patient has an irregular heart  rhythm consistent with atrial fibrillation with a rate ranging anywhere from  110 to 160.  She currently tells me that she is feeling a little bit better  and she does feel palpitations.  Currently she denies any chest pain.  She  initially was feeling short of breath earlier, but now has no complaints of  that.  She denies any other symptoms such as  headache, visual changes.  She  does complain of a sore throat, but denies any dysphagia.  She has no  abdominal pain, no hematuria, dysuria, or constipation.  She has problems  with persistent diarrhea.  She feels hungry and is hungry often.  She denies  any focal extremity pain or weakness.  Other labs were checked including  cardiac enzymes, all of which were negative.  EKG again confirms rapid  atrial fibrillation.   PAST MEDICAL HISTORY:  Recently diagnosed hyperthyroidism, likely Graves  disease.  She also has hypertension most likely secondary from the  hyperthyroidism as well as diarrhea again secondary to the hyperthyroidism.   MEDICATIONS:  1.  Atenolol 25 mg daily.  2.  Methimazole 5 mg p.o. t.i.d.   These medications have been only recently started in the past few weeks.   ALLERGIES:  No known drug allergies.   HABITS:  She denies any alcohol, tobacco, or drug use.   FAMILY HISTORY:  Noncontributory.   PHYSICAL EXAMINATION:  VITAL SIGNS:  Temperature 98.9, heart rate 103 but  has gone up to as high as 160, blood pressure anywhere from 118/75 to  136/91, respirations 35, O2 saturation 100% on 2 liters.  GENERAL:  The patient appears to be alert and oriented.  In no acute  distress.  HEENT:  Normocephalic and atraumatic.  Her mucous membranes are slightly  dry.  She has no carotid bruits.  She has a prominent goiter which feels  diffusely hemogenous in nature.  HEART:  Irregular rhythm with a rapid rate consistent with atrial  fibrillation.  LUNGS:  Clear to auscultation bilaterally.  ABDOMEN:  Soft, nontender, nondistended with positive bowel sounds.  EXTREMITIES:  No clubbing or cyanosis. There is trace pitting edema.   LABORATORY DATA:  White count 8.1 with a 64% shift, H&H 12.8 and 36.7, MCV  77, platelet count 278.  Sodium 139, potassium 3.8, chloride 111, bicarb 24,  BUN 7, creatinine 0.5, glucose 105.  Albumin slightly low at 3.2, alkaline  phosphatase  slightly up at 126.  The rest of her LFT's are within normal  limits.  Urine pregnancy is negative.  CK-MB is 38.8 and 1.2, troponin I  less than 0.05.   EKG shows rapid atrial fibrillation.   ASSESSMENT:  1.  Rapid atrial fibrillation certainly secondary to hyperthyroidism.      Difficult to say how long this has been going on for.  She has had      palpitations for eight months, but was seen at [facility] last week      and atrial fibrillation was not noted.  Will treat with Cardizem 20 mg      bolus and 5 mg drip.  Anticoagulate with Lovenox and Coumadin.  Check a      two-dimensional echocardiogram.  [facility] to see tomorrow      about reversing this.  2.  Hyperthyroidism.  Continue Methimazole which was started last week.  The      patient really needs to consider ablation.  3.  Hypertension likely secondary to hyperthyroidism.  She is on a Cardizem      drip, holding her Atenolol for now.  4.  Diarrhea secondary to hyperthyroidism.  Imodium p.r.n.  She will need to      treat the underlying issue.      Send   SKK/MEDQ  D:  06/25/2004  T:  06/25/2004  Job:  [id number]   cc:   Dineen Kid. Reche Dixon, M.D.  [address] [place]  [place] [address]  Fax: [phone]

## 2010-10-30 NOTE — Op Note (Signed)
[facility]  Patient:    Judith Kelly, Judith Kelly                         MRN: [id number] Proc. Date: 10/04/99 Adm. Date:  [id number] Attending:  Michaelle Copas                           Operative Report  PREOPERATIVE DIAGNOSIS:       Desires sterilization.  POSTOPERATIVE DIAGNOSIS:      Desires sterilization.  PROCEDURE:                    Bilateral partial salpingectomy (Pomeroy technique).  SURGEON:                      Charles A. Clearance Coots, M.D.  ANESTHESIA:                   Epidural.  ESTIMATED BLOOD LOSS:         Negligible.  COMPLICATIONS:                None.  SPECIMEN:                     Approximately 2-cm segments of right and left fallopian tubes.  DESCRIPTION OF PROCEDURE:     The patient was brought to the operating room and  after satisfactory redosing of the epidural, the abdomen was prepped and draped in the usual sterile fashion.  A small inferior umbilical incision was made with a  scalpel that was deepened down to the fascia with curved Mayo scissors.  The fascia was grasped in the midline and the fascial incision was cut transversely between two Kelly forceps, down to the peritoneum.  The fascial incision was extended to the left and to the right with the curved Mayo scissors.  The peritoneum was grasped with hemostats and was incised with Metzenbaum scissors.  Right-angle retractors were placed in the incision and the right fallopian tube was identified and was grasped with a Babcock clamp.  The tube was followed from the cornual end to the fimbrial end serially in the grasp of Babcock clamps.  The tube was then  followed in a retrograde fashion back to the isthmic area of the tube and a knuckle of tube beneath the Babcock clamp was doubly ligated with #1 plain catgut and the specimen was submitted to pathology for evaluation.  There was no active bleeding from the tubal stumps and were therefore placed in their  normal anatomic position. The same procedure was performed on the opposite side without complications. The abdomen was then closed as follows:  The peritoneum and fascia were closed as one with a continuous suture of 2-0 Vicryl.  The subcutaneous tissue was approximated with a few interrupted sutures of 2-0 Vicryl.  The skin was closed with a continuous subcuticular suture of 4-0 Monocryl.  A sterile bandage was applied o the incision closure.  The surgical technician indicated that all needle, sponge and instrument counts were correct.  The patient tolerated the procedure well and was transported to the recovery room in satisfactory condition. DD:  10/04/99 TD:  10/05/99 Job: [id number]

## 2010-10-30 NOTE — Discharge Summary (Signed)
Judith Kelly, Judith Kelly NO.:  [id number]   MEDICAL RECORD NO.:  [id number]          PATIENT TYPE:  INP   LOCATION:  3730                         FACILITY:  [facility]   PHYSICIAN:  Theone Stanley, MD   DATE OF BIRTH:  [birth year]   DATE OF ADMISSION:  06/25/2004  DATE OF DISCHARGE:  06/27/2004                                 DISCHARGE SUMMARY   ADMISSION DIAGNOSES:  1.  Atrial fibrillation with rapid ventricular response.  2.  Hypothyroidism most likely Grave's disease.   DISCHARGE DIAGNOSES:  1.  Atrial fibrillation most likely secondary to hypothyroidism.  2.  Hypothyroidism.   CONSULTATIONS:  None.   PROCEDURE:  The patient had echocardiogram done on June 26, 2004.  It  showed left ventricular size was normal.  Overall systolic function was  normal.  EF ranging from 55-65%.  Left ventricular wall thickness was  normal.  No evidence of aortic valve stenosis or regurgitation.  Aortic root  was normal.  Trivial mitral valvular regurgitation.  Left atrium was normal  in size.  No evidence of clot.   HOSPITAL COURSE:  Judith Kelly is a very pleasant 40 year old Spanish  speaking only female presenting with increasing shortness of breath and  rapid heart rate.  She recently had been diagnosed with hypothyroidism by  Dr. Dineen Kid. Reche Dixon, who suspects she has Grave's disease.  A thyroid scan  showed diffuse uptake.  Because of increased shortness of breath and  palpitations, she came to the emergency room.   In the ER, it was noted that the patient had atrial fibrillation with RVR.  The patient was placed on Diltiazem drip.  The patient converted to normal  sinus rhythm by the next day.  Diltiazem drip was stopped and her Atenolol  was increased from 25 mg q.d. to 25 mg b.i.d.  While in the hospital, she  was started on Lovenox and Coumadin for anticoagulation.   After conversion, the patient remained in normal sinus rhythm throughout her  stay.  Because  echocardiogram did not show any evidence of a clot, the  patient was discharged on Coumadin with INR of 1.2 with suspicion that she  would be up to 2.0 within a couple of days.  Her methimazole was increased  from 5 mg t.i.d. to 10 mg t.i.d. to see if this might help with her  hypothyroidism.  Her TSH on admission was 0.008.   The patient was doing well enough and remained in normal sinus rhythm it was  felt she could be discharged.  She was discharged in stable condition on the  14th.   DISCHARGE MEDICATIONS:  1.  Atenolol 25 mg 1 p.o. b.i.d.  2.  Coumadin 5 mg 1 p.o. q.d.  The patient was given only 7 days worth of      Coumadin.  She will need to get more from her primary care physician.  3.  Methimazole 10 mg t.i.d.   DISCHARGE INSTRUCTIONS:  The patient was to continue her usual activity.  No  dietary restrictions.  She is to follow up with Dr. Dineen Kid.  Talbot at  [facility] in three days with a INR, PT and CBC.  If it is felt that she  needs to continue with her Coumadin, Dr. Dineen Kid. Reche Dixon will need to manage  this.  Also, the patient will most likely need an ablation since she is not  well controlled currently with her medications.      Atta   AEJ/MEDQ  D:  06/28/2004  T:  06/28/2004  Job:  [id number]

## 2010-11-04 ENCOUNTER — Encounter: Payer: Self-pay | Admitting: Family Medicine

## 2010-12-15 ENCOUNTER — Encounter: Payer: Self-pay | Admitting: Family Medicine

## 2010-12-15 ENCOUNTER — Ambulatory Visit (INDEPENDENT_AMBULATORY_CARE_PROVIDER_SITE_OTHER): Payer: Self-pay | Admitting: Family Medicine

## 2010-12-15 DIAGNOSIS — H8110 Benign paroxysmal vertigo, unspecified ear: Secondary | ICD-10-CM

## 2010-12-15 NOTE — Progress Notes (Signed)
  Subjective:    Patient ID: Judith Kelly, female    DOB: March 22, 1971, 40 y.o.   MRN: [id number]  HPI When laying back flat, dizziness 2-3 seconds,  When up on tippy toes.  No nausea. No vomiting.  Blurry fision. Feels like the room is spinning.  H/a off and on.  Episodes similar to in the past but these symptoms are now brought on by lying back flat/face up.  (in the past was brought on by laying on side).  Has a hx of vertigo.  No n/v.  No blurry vision.  Occasional h/a (has hx of migraines).  No neck rigidity.  No falls  Review of Systems    as per above.  Objective:   Physical Exam  Constitutional: She is oriented to person, place, and time. She appears well-developed and well-nourished.  HENT:  Head: Normocephalic and atraumatic.  Eyes: Pupils are equal, round, and reactive to light. Right eye exhibits no discharge. Left eye exhibits no discharge.  Neck: Normal range of motion. Neck supple. No thyromegaly present.  Cardiovascular: Normal rate, regular rhythm and normal heart sounds.   No murmur heard. Pulmonary/Chest: Effort normal and breath sounds normal. No respiratory distress. She has no wheezes.  Abdominal: Soft. She exhibits no distension. There is no tenderness.  Musculoskeletal: Normal range of motion. She exhibits no edema.  Neurological: She is alert and oriented to person, place, and time. She has normal reflexes. She displays normal reflexes. No cranial nerve deficit. She exhibits normal muscle tone. Coordination normal.       Normal gait.  Normal sensation.negative Romberg.   Psychiatric: She has a normal mood and affect. Her behavior is normal. Judgment and thought content normal.          Assessment & Plan:

## 2010-12-15 NOTE — Assessment & Plan Note (Signed)
History and physical consistent with repeat episode of BPPV.  No red flags on exam.  Pt to do home exercises (has handout at home) 3 x per day.  If no improvement pt is to let me know and I will put in referral for PT (as she has had in past).  If new or worsening of symptoms pt is to return.  Gave red flags for return.  Antivert not prescribed since episodes last only seconds and then resolve.

## 2010-12-17 ENCOUNTER — Telehealth: Payer: Self-pay | Admitting: *Deleted

## 2010-12-17 ENCOUNTER — Telehealth: Payer: Self-pay | Admitting: Family Medicine

## 2010-12-17 ENCOUNTER — Encounter: Payer: Self-pay | Admitting: Family Medicine

## 2010-12-17 ENCOUNTER — Other Ambulatory Visit: Payer: Self-pay | Admitting: Family Medicine

## 2010-12-17 DIAGNOSIS — IMO0002 Reserved for concepts with insufficient information to code with codable children: Secondary | ICD-10-CM

## 2010-12-17 NOTE — Telephone Encounter (Signed)
Message copied by Arlyss Repress on Thu Dec 17, 2010  4:36 PM ------      Message from: [place], [place] M      Created: Thu Dec 17, 2010 10:01 AM       Hey, just placed an order for an mri on this pt.   Please schedule.  Thanks, [employer]

## 2010-12-17 NOTE — Telephone Encounter (Signed)
Called pt.spoke with pt's daughter. Will schedule MRI and call them back (D[facility] Card) Arlyss Repress

## 2010-12-17 NOTE — Telephone Encounter (Signed)
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  Pt states understanding.

## 2010-12-17 NOTE — Telephone Encounter (Signed)
Tried to schedule MRI. Pt has dx of a-fib. 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 Also needs B-met drawn before the MRI. Called pt back and spoke with daughter. Dr.Caviness please order the B-met and call the pt. Thanks, Angelica Frandsen

## 2010-12-21 ENCOUNTER — Telehealth: Payer: Self-pay | Admitting: *Deleted

## 2010-12-21 ENCOUNTER — Telehealth (HOSPITAL_COMMUNITY): Payer: Self-pay | Admitting: Family Medicine

## 2010-12-21 NOTE — Telephone Encounter (Signed)
Please see previous notes. Judith Kelly, Judith Kelly

## 2010-12-21 NOTE — Telephone Encounter (Signed)
Lab appointment made.  No h/o pacemaker.

## 2010-12-21 NOTE — Telephone Encounter (Signed)
Left message requesting pt to call me back.  Will need to make lab appointment for bmet.  Pt has no history in chart/record of pacemaker.

## 2010-12-21 NOTE — Telephone Encounter (Signed)
Pt is returning phone call. Best contact number is [phone].  Dr. Arlys John can call her any time. Marines

## 2010-12-21 NOTE — Telephone Encounter (Signed)
appt for lab work made for 3pm on 12/21/10.  Will check bmet.  When results back will have staff schedule mri.

## 2010-12-22 ENCOUNTER — Other Ambulatory Visit: Payer: Self-pay

## 2010-12-22 ENCOUNTER — Telehealth: Payer: Self-pay | Admitting: *Deleted

## 2010-12-23 ENCOUNTER — Other Ambulatory Visit: Payer: Self-pay

## 2010-12-23 ENCOUNTER — Other Ambulatory Visit: Payer: Self-pay | Admitting: Family Medicine

## 2010-12-23 DIAGNOSIS — R9089 Other abnormal findings on diagnostic imaging of central nervous system: Secondary | ICD-10-CM

## 2010-12-23 NOTE — Progress Notes (Signed)
Bmp done today Judith Kelly 

## 2010-12-24 LAB — BASIC METABOLIC PANEL
BUN: 14 mg/dL (ref 6–23)
Potassium: 4.4 mEq/L (ref 3.5–5.3)

## 2010-12-24 NOTE — Telephone Encounter (Signed)
done

## 2010-12-24 NOTE — Telephone Encounter (Signed)
Called to schedule MRI. Pt has appt already 12-29-10 and I gave them the B-met results. Lorenda Hatchet, Renato Battles

## 2010-12-24 NOTE — Telephone Encounter (Signed)
error 

## 2010-12-29 ENCOUNTER — Ambulatory Visit (HOSPITAL_COMMUNITY)
Admission: RE | Admit: 2010-12-29 | Discharge: 2010-12-29 | Disposition: A | Discharge status: Home / Self Care | Payer: Self-pay | Source: Ambulatory Visit | Attending: Family Medicine | Admitting: Family Medicine

## 2010-12-29 DIAGNOSIS — J329 Chronic sinusitis, unspecified: Secondary | ICD-10-CM | POA: Insufficient documentation

## 2010-12-29 DIAGNOSIS — G935 Compression of brain: Secondary | ICD-10-CM | POA: Insufficient documentation

## 2010-12-29 DIAGNOSIS — IMO0002 Reserved for concepts with insufficient information to code with codable children: Secondary | ICD-10-CM

## 2010-12-30 MED ORDER — GADOBENATE DIMEGLUMINE 529 MG/ML IV SOLN
20.0000 mL | Freq: Once | INTRAVENOUS | Status: AC
  Administered 2010-12-29: 20 mL via INTRAVENOUS

## 2011-01-07 ENCOUNTER — Telehealth (HOSPITAL_COMMUNITY): Payer: Self-pay | Admitting: Family Medicine

## 2011-01-07 NOTE — Telephone Encounter (Signed)
Pt called and will like to know about  MRI result. Thank you  Marines

## 2011-01-08 ENCOUNTER — Telehealth: Payer: Self-pay | Admitting: Family Medicine

## 2011-01-08 NOTE — Telephone Encounter (Signed)
I called pt to discuss mri results.  No answer.  Message left telling pt that "the results were normal" and to call me back today at the office if she would like to know more details.

## 2011-01-25 ENCOUNTER — Encounter: Payer: Self-pay | Admitting: Family Medicine

## 2011-01-25 ENCOUNTER — Ambulatory Visit (INDEPENDENT_AMBULATORY_CARE_PROVIDER_SITE_OTHER): Payer: Self-pay | Admitting: Family Medicine

## 2011-01-25 DIAGNOSIS — J069 Acute upper respiratory infection, unspecified: Secondary | ICD-10-CM | POA: Insufficient documentation

## 2011-01-25 NOTE — Progress Notes (Signed)
  Subjective:    Patient ID: Judith Kelly, female    DOB: 1971-04-08, 40 y.o.   MRN: [id number]  HPI Pt withseveral days of congestion and sneezing, itchy ears and throat.  No fevers, no sick contacts.  Normal appetite.  Some sputum with trace blood.    Review of Systems Denies CP, SOB, HA, N/V/D, fever     Objective:   Physical Exam  Vital signs reviewed General appearance - alert, well appearing, and in no distress and oriented to person, place, and time Heart - normal rate, regular rhythm, normal S1, S2, no murmurs, rubs, clicks or gallops Chest - clear to auscultation, no wheezes, rales or rhonchi, symmetric air entry, no tachypnea, retractions or cyanosis Eyes - pupils equal and reactive, extraocular eye movements intact, sclera anicteric Ears - bilateral TM's and external ear canals normal, right ear normal, left ear normal Nose - normal and patent, moderate clear-yellow d/c Throat- mild erythema of posterior palate.  No tonsillar discharge.        Assessment & Plan:  Viral URI Reviewed common symptoms, symptomatic care.  Advised to follow up as needed.

## 2011-01-25 NOTE — Patient Instructions (Signed)
Infeccin [employer] Areas Superiores (Resfro) (Upper Respiratory Infections [Cold]) Usted sufre una infeccin respiratoria viral, o resfro. Los sntomas pueden ser nariz tapada o que [place], [place], dolor de garganta y tos. Muchos tipos diferentes de virus pueden causar resfros. Los antibiticos no son de [employer] resfros, excepto que hubiera una infeccin bacteriana [place], como una infeccin [place], sinusitis o bronquitis. El tratamiento para el alivio de los sntomas consiste en:  Reposo.   Aumento de la ingesta de lquidos.   Utilizacin de Energy manager.   Uso de descongestivos por va oral y pastillas para la tos.   Descongestivos nasales en aerosol (no los utilice durante ms de [address]).   Podr tomar para Engineer, materials y la [place].   Para evitar la diseminacin de resfrios a otros miembros de la familia, lvese las manos con frecuencia, especialmente despus de toser o tocarse la boca o la nariz. Use pauelos de papel descartables.  CONSULTE AL PROFESIONAL QUE LO ASISTE SI:  La fiebre ((100.5 F/38.0 C  ms) dure ms de 3 das.   Presenta nuevos sntomas que le hacen sospechar que presenta algo ms que un resfro.   El dolor de cabeza o la tos le duran ms de [address].  SOLICITE ATENCIN MDICA DE INMEDIATO SI:  La fiebre es muy alta (102.5 F/ 39.2 C  ms).   Siente un dolor intenso.   Se siente mareado o tiene un episodio de [place].   Siente opresin o Journalist, newspaper, o le falta el aire.   Vomita o tiene deposiciones diarreicas repetidas veces.  Document Released: 05/31/2005 Document Re-Released: 08/27/2008 [facility] Patient Information 2011 [place], [place].

## 2011-01-25 NOTE — Assessment & Plan Note (Signed)
Reviewed common symptoms, symptomatic care.  Advised to follow up as needed.

## 2011-02-04 ENCOUNTER — Ambulatory Visit (INDEPENDENT_AMBULATORY_CARE_PROVIDER_SITE_OTHER): Payer: Self-pay | Admitting: Family Medicine

## 2011-02-04 ENCOUNTER — Ambulatory Visit: Payer: Self-pay

## 2011-02-04 VITALS — BP 161/103 | HR 80 | Temp 97.8°F | Wt 191.0 lb

## 2011-02-04 DIAGNOSIS — R300 Dysuria: Secondary | ICD-10-CM

## 2011-02-04 DIAGNOSIS — N3000 Acute cystitis without hematuria: Secondary | ICD-10-CM

## 2011-02-04 LAB — POCT URINALYSIS DIPSTICK
Bilirubin, UA: NEGATIVE
Glucose, UA: NEGATIVE
Ketones, UA: NEGATIVE
Nitrite, UA: NEGATIVE
Protein, UA: NEGATIVE
Spec Grav, UA: 1.02
Urobilinogen, UA: 0.2
pH, UA: 6

## 2011-02-04 LAB — POCT UA - MICROSCOPIC ONLY
Epithelial cells, urine per micros: >20
WBC, Ur, HPF, POC: >20

## 2011-02-04 MED ORDER — CEPHALEXIN 500 MG PO CAPS: 500.0000 mg | ORAL_CAPSULE | Freq: Two times a day (BID) | ORAL | Status: AC

## 2011-02-04 NOTE — Progress Notes (Signed)
Subjective:    Judith Kelly is a 40 y.o. female who complains of dysuria, frequency, hesitancy and incomplete bladder emptying. She has had symptoms for 2 day.  Patient denies back pain, fever and stomach ache. Patient does not have a history of recurrent UTI. Patient does not have a history of pyelonephritis. She does complain of continued headaches, which has been going on a long time.  She has had an MRI, which was normal.  She is wondering if her blood pressure could be causing her headaches.   The following portions of the patient's history were reviewed and updated as appropriate: allergies, current medications, past family history, past medical history, past social history, past surgical history and problem list.  Review of Systems Pertinent items are noted in HPI.    Objective:    BP 161/103  Pulse 80  Temp(Src) 97.8 F (36.6 C) (Oral)  Wt 191 lb (86.637 kg)  LMP 01/04/2011 General appearance: alert, cooperative and no distress Back: symmetric, no curvature. ROM normal. No CVA tenderness. Lungs: clear to auscultation bilaterally Heart: regular rate and rhythm, S1, S2 normal, no murmur, click, rub or gallop Abdomen: Mild suprapubic tenderness to palpation, otherwise soft and non-tender, +bs.  Extremities: extremities normal, atraumatic, no cyanosis or edema Pulses: 2+ and symmetric  Laboratory:  Urine dipstick: 1+ for hemoglobin and 1+ for leukocyte esterase.    Assessment:    Acute cystitis     Plan:    Medications: Keflex 500 mg PO BID.. Maintain adequate hydration. Follow up if symptoms not improving, and as needed.  Asked patient to schedule appointment with PCP to follow up BP and headaches.

## 2011-02-04 NOTE — Progress Notes (Signed)
Addended by: [place], Hugo Lybrand on: 02/04/2011 04:17 PM   Modules accepted: Orders

## 2011-02-04 NOTE — Patient Instructions (Signed)
I am sorry you are not feeling good.  I am going to give you an antibiotic for your urine, take it twice a day for 7 days.    Please make an appointment with Dr. Edmonia James on your way out to discuss your blood pressure and your headaches.

## 2011-02-08 ENCOUNTER — Ambulatory Visit (INDEPENDENT_AMBULATORY_CARE_PROVIDER_SITE_OTHER): Payer: Self-pay | Admitting: Family Medicine

## 2011-02-08 ENCOUNTER — Encounter: Payer: Self-pay | Admitting: Family Medicine

## 2011-02-08 VITALS — BP 145/98 | HR 87 | Wt 190.9 lb

## 2011-02-08 DIAGNOSIS — I10 Essential (primary) hypertension: Secondary | ICD-10-CM | POA: Insufficient documentation

## 2011-02-08 DIAGNOSIS — E018 Other iodine-deficiency related thyroid disorders and allied conditions: Secondary | ICD-10-CM

## 2011-02-08 MED ORDER — HYDROCHLOROTHIAZIDE 25 MG PO TABS: 25.0000 mg | ORAL_TABLET | Freq: Every day | ORAL | Status: DC

## 2011-02-08 MED ORDER — LEVOTHYROXINE SODIUM 175 MCG PO TABS: 175.0000 ug | ORAL_TABLET | Freq: Every day | ORAL | Status: DC

## 2011-02-08 NOTE — Patient Instructions (Signed)
Fue un Research officer, trade union.  Estoy aumentandole la dosificacion de la Synthroid, de a 175 una vez por dia.  Estamos chequeando la prueba de la tiroide Designer, multimedia.  Estamos comenzando una nueva medicina para la presion; que puede ser temporal si la presion se normaliza con la bajada de [place].   Quiero que venga de nuevo con la Dra Caviness o conmigo en 2 a 4 semanas para chequear la presion y para ver como esta' emocionalmente.  MAKE APPOINTMENT WITH DR CAVINESS OR WITH DR Mauricio Po IN 2 TO 4 WEEKS FOR FOLLOW UP.

## 2011-02-08 NOTE — Assessment & Plan Note (Signed)
Patient's TSH has remained controlled for quite some time.  Of note, her value 3 months ago was roughly double her prior values for several checks in a row.  While she is euthyroid by TSH measurement, I am rechecking today along with fT4 and adjusting her dose of LT4 upward by around 20% to see if this drops her TSH and improves her ability to lose weight, helps depression.  Would be inclined to perform a more formal PHQ9 screening if depression/libido symptoms do not improve despite weight loss/TSH reduction in 2 months.  Will plan to recheck TSH in 2 months.

## 2011-02-08 NOTE — Assessment & Plan Note (Signed)
Likely secondary to weight gain.  No changes in diet or exercise.  I discussed why I do not prescribe phentermine or other meds for weight loss.  To start HCTZ now, with plan to possibly reduce/remove once weight loss in place.    COnsider sleep apnea as secondary cause of elevated BP as well or co-morbidity.  Also to consider A1C for DM screening at next visit.

## 2011-02-08 NOTE — Progress Notes (Signed)
  Subjective:    Patient ID: Judith Kelly, female    DOB: 04/24/1971, 40 y.o.   MRN: [id number]  HPI Visit conducted in Spanish.   Patient comes in for follow up on blood pressure, which has been elevated for the past several visits dating back to May 2012 (and a few in summer 2011).  She ascribes to weight gain, gained 15 lbs in several months.  Snores now. Feels tired.  Has been taking her Synthroid daily for several years since having radioactive ablation of thyroid, does not skip doses.  Feels loss of libido and lacks intimacy with her husband, whom she loves and feels bad about her lack of interest.  Tearful when discussing.  Had been taking phentermine for weight loss, however it ran out about 2-3 months ago and she has gained weight since.    No prior history of depression.    Family Hx; No family history of DM or CAD.     Review of Systems Sleeps from 1030pm to 6am; working presently.  Lives with husband and 3 children (ages 72, 13, 30).  Does not smoke or drink alcohol.       Objective:   Physical Exam Alert, well appearing, no apparent distress.  Tearful when discussing emotional topics.   HEENT Neck supple,. No thyroid nodules or adenopathy.  MMM. COR S1S2, no extra sounds, no murmurs PULM Clear bilaterally, no rales or wheezes.  LEs: No pitting edema noted; palpable dp pulses bilaterally.        Assessment & Plan:

## 2011-02-11 ENCOUNTER — Encounter: Payer: Self-pay | Admitting: Family Medicine

## 2011-02-17 ENCOUNTER — Ambulatory Visit: Payer: Self-pay | Admitting: Family Medicine

## 2011-02-26 ENCOUNTER — Ambulatory Visit (INDEPENDENT_AMBULATORY_CARE_PROVIDER_SITE_OTHER): Payer: Self-pay | Admitting: Family Medicine

## 2011-02-26 ENCOUNTER — Encounter: Payer: Self-pay | Admitting: Family Medicine

## 2011-02-26 VITALS — BP 152/99 | HR 72 | Wt 188.9 lb

## 2011-02-26 DIAGNOSIS — I10 Essential (primary) hypertension: Secondary | ICD-10-CM

## 2011-02-26 DIAGNOSIS — R6882 Decreased libido: Secondary | ICD-10-CM

## 2011-02-26 DIAGNOSIS — H8110 Benign paroxysmal vertigo, unspecified ear: Secondary | ICD-10-CM

## 2011-02-26 DIAGNOSIS — E669 Obesity, unspecified: Secondary | ICD-10-CM

## 2011-02-26 MED ORDER — LISINOPRIL 5 MG PO TABS: 5.0000 mg | ORAL_TABLET | Freq: Every day | ORAL | Status: DC

## 2011-02-26 NOTE — Patient Instructions (Signed)
toma la pastilla nueva por la presion de [place]- se llama lisinopril. siga chequando la presion de [employer] casa, y traerla a la proxima cita. necesita hacer una cita con el labortorio para Dealer- en una semana.  Siga haciendo los exercisios para el vertigo.    Needs lab appointment for in 1 week.

## 2011-02-27 ENCOUNTER — Encounter: Payer: Self-pay | Admitting: Family Medicine

## 2011-02-27 NOTE — Progress Notes (Signed)
  Subjective:    Patient ID: Judith Kelly, female    DOB: 02/08/1971, 40 y.o.   MRN: [id number]  HPI Blood pressure: Patient has been testing blood pressure at home. Has consistently been in the 150s and 140s systolic. Patient is taking HCTZ 25 mg daily as directed. No syncope. No vision changes.  TSH: Levothyroxine dosage increased by Dr. Mauricio Po at last appointment. We'll need to recheck in 2 months. Patient states that her fatigue is improving, but she still is having problems with sexual desire.  Obesity: Patient states that she is struggling to find time to work out. Has not been up to lose weight recently. No longer taking weight loss medication.  Dizziness: Has history of benign paroxysmal positional vertigo. Has not had any problems with this in a while. During past few weeks when laying back on to bed or when stretching arms into air has noticed some dizziness. Would like to be checked out to see if this is the same thing or a new problem. No falls. Only lasts a few seconds. No vision changes. No nausea. No vomiting.   Review of Systems As per above    Objective:   Physical Exam  Constitutional: She is oriented to person, place, and time. She appears well-developed and well-nourished.  HENT:  Head: Normocephalic and atraumatic.  Eyes: Pupils are equal, round, and reactive to light.  Cardiovascular: Normal rate, regular rhythm and normal heart sounds.   No murmur heard. Pulmonary/Chest: Effort normal. No respiratory distress. She has no wheezes. She has no rales.  Abdominal: Soft. She exhibits no distension. There is no tenderness.  Musculoskeletal: She exhibits no edema.  Neurological: She is alert and oriented to person, place, and time.       Dix-Hallpike maneuver: Cyclic nystagmus when patient laid back on to left side. This did fatigue quickly, unable to reproduce on second attempt. Did have dizziness with nystagmus, that lasted only a few seconds.   Skin: No rash  noted.  Psychiatric: She has a normal mood and affect. Her behavior is normal.          Assessment & Plan:

## 2011-02-27 NOTE — Assessment & Plan Note (Signed)
Patient to return in one month. We'll discuss treatment plan for obesity in more detail at that appointment.

## 2011-02-27 NOTE — Assessment & Plan Note (Signed)
Blood pressure persistently elevated with systolics into 140s 150s. We'll add additional agent. Lisinopril. Patient aware of side effects. Patient to return in one week for blood redraw to check bmet. Patient return in one month for bp recheck.

## 2011-02-27 NOTE — Assessment & Plan Note (Signed)
This is a reoccurrence of patient's benign paroxysmal positional vertigo.  Patient to do home exercises. If no improvement will refer to physical therapy as in past.

## 2011-02-27 NOTE — Assessment & Plan Note (Addendum)
No improvement in sexual desire with increased dosage of Synthroid. We'll continue Synthroid higher dosage and monitor patient's symptoms. Patient to return in one to 2 months. If TSH improved the patient continues to have symptoms consider other treatment modalities.

## 2011-03-05 ENCOUNTER — Other Ambulatory Visit: Payer: Self-pay

## 2011-03-05 DIAGNOSIS — I10 Essential (primary) hypertension: Secondary | ICD-10-CM

## 2011-03-05 LAB — BASIC METABOLIC PANEL
BUN: 17 mg/dL (ref 6–23)
Calcium: 9.5 mg/dL (ref 8.4–10.5)
Creat: 0.72 mg/dL (ref 0.50–1.10)
Glucose, Bld: 82 mg/dL (ref 70–99)
Potassium: 4 mEq/L (ref 3.5–5.3)

## 2011-03-05 NOTE — Progress Notes (Signed)
LABS DRAWN FOR BMP CNEWSOME

## 2011-04-05 ENCOUNTER — Ambulatory Visit (INDEPENDENT_AMBULATORY_CARE_PROVIDER_SITE_OTHER): Payer: Self-pay | Admitting: Family Medicine

## 2011-04-05 ENCOUNTER — Encounter: Payer: Self-pay | Admitting: Family Medicine

## 2011-04-05 VITALS — BP 135/85 | HR 79 | Ht 58.25 in | Wt 189.6 lb

## 2011-04-05 DIAGNOSIS — R6882 Decreased libido: Secondary | ICD-10-CM

## 2011-04-05 DIAGNOSIS — E018 Other iodine-deficiency related thyroid disorders and allied conditions: Secondary | ICD-10-CM

## 2011-04-05 DIAGNOSIS — Z7189 Other specified counseling: Secondary | ICD-10-CM

## 2011-04-05 DIAGNOSIS — I10 Essential (primary) hypertension: Secondary | ICD-10-CM

## 2011-04-05 DIAGNOSIS — E669 Obesity, unspecified: Secondary | ICD-10-CM

## 2011-04-05 DIAGNOSIS — Z719 Counseling, unspecified: Secondary | ICD-10-CM | POA: Insufficient documentation

## 2011-04-05 DIAGNOSIS — Z23 Encounter for immunization: Secondary | ICD-10-CM

## 2011-04-05 LAB — TSH: TSH: 1.482 u[IU]/mL (ref 0.350–4.500)

## 2011-04-05 LAB — POCT GLYCOSYLATED HEMOGLOBIN (HGB A1C): Hemoglobin A1C: 5.9

## 2011-04-05 NOTE — Assessment & Plan Note (Signed)
Patient dose of Synthroid increased to 175 mcg approximately 2 months ago. We'll recheck TSH today. Patient states that energy has improved slightly, but continues to have decreased libido and problems with weight loss and mood swings.

## 2011-04-05 NOTE — Progress Notes (Deleted)
  Subjective:    Patient ID: Judith Kelly, female    DOB: 03-09-1971, 40 y.o.   MRN: [id number]  HPI    Review of Systems     Objective:   Physical Exam        Assessment & Plan:

## 2011-04-05 NOTE — Progress Notes (Addendum)
  Subjective:    Patient ID: Judith Kelly, female    DOB: [birth year], 40 y.o.   MRN: [id number]  HPI Hypertension: Patient has been taking lisinopril and HCTZ as directed. Blood pressure 135/85. Patient states she no longer has headaches since taking lisinopril.  Health maintenance: Patient is up-to-date on Pap smear within normal limits on February 2011. Receive flu shot today. Patient to schedule mammogram.  Thyroid management: Patient is taking her medication as directed. Was increased to 175 mcg probably 2 months ago. Patient endorses that mood swings not improved, no improvement in sexual relations. Continues to lack libido. But did notice some improvement in energy with the increase in thyroid dosage. Patient agrees to recheck her thyroid today. No heat or cold intolerance.  P HQ-9 score of 7. Patient denies any feelings of depression except for occasional feelings related to lack of confidence from obesity.     Review of Systems As per above.    Objective:   Physical Exam  Constitutional: She is oriented to person, place, and time. She appears well-developed and well-nourished.  HENT:  Head: Normocephalic and atraumatic.  Neck: No thyromegaly present.  Cardiovascular: Normal rate and regular rhythm.   Murmur heard. Pulmonary/Chest: Effort normal. No respiratory distress.  Musculoskeletal: She exhibits no edema.  Neurological: She is alert and oriented to person, place, and time.  Skin: No rash noted.  Psychiatric: She has a normal mood and affect. Her behavior is normal.          Assessment & Plan:

## 2011-04-05 NOTE — Assessment & Plan Note (Signed)
Blood pressure now within normal limits with the addition of lisinopril 5 mg. Creatinine at recheck 2 weeks after starting lisinopril was within normal limits.

## 2011-04-05 NOTE — Assessment & Plan Note (Signed)
No improvement with increase in thyroid dosage. Will recheck TSH today.

## 2011-04-05 NOTE — Assessment & Plan Note (Signed)
Patient had Pap smear-within normal limits in February 2011 Has received flu vaccine. Patient has not had mammograms info given to patient today.

## 2011-04-12 ENCOUNTER — Telehealth (HOSPITAL_COMMUNITY): Payer: Self-pay | Admitting: Family Medicine

## 2011-04-12 NOTE — Telephone Encounter (Signed)
Pt called and stated the medication is not in the pharmacy for her. Pt will like you send the medication to the pharmacy that is in our system. And also will like you send ACYCLOVIR 200 mg. About last medication pt has a conversation with you previously.    Marines

## 2011-04-18 ENCOUNTER — Encounter: Payer: Self-pay | Admitting: Family Medicine

## 2011-06-16 ENCOUNTER — Encounter: Payer: Self-pay | Admitting: Family Medicine

## 2011-06-16 ENCOUNTER — Ambulatory Visit (INDEPENDENT_AMBULATORY_CARE_PROVIDER_SITE_OTHER): Payer: Self-pay | Admitting: Family Medicine

## 2011-06-16 DIAGNOSIS — R6882 Decreased libido: Secondary | ICD-10-CM

## 2011-06-16 DIAGNOSIS — L299 Pruritus, unspecified: Secondary | ICD-10-CM

## 2011-06-16 DIAGNOSIS — B009 Herpesviral infection, unspecified: Secondary | ICD-10-CM

## 2011-06-16 NOTE — Progress Notes (Signed)
  Subjective:    Patient ID: Judith Kelly, female    DOB: 03/12/1971, 41 y.o.   MRN: [id number]  HPI Itching all over her scalp and torso: Patient reports itching of scalp off and on x2 months. Had hair colored for 2 months ago and since that time has had problems with this. Occasionally will have flaking scalp. No rashes or lesions in scalp. No pain or burning. Patient began to itch all over in back of torso. This started about 2-3 weeks ago. Began right after starting using a new scent capsule for cleaning clothes. Stopped using these scent capsules when she began to itch.  yet the itching has continued during the past 2 weeks. Patient states she has not had any rash. Has been using cocoa butter on back and abdomen. This helps itching a little bit.  Genital Herpes: Patient was told by another provider a while back that she has genital herpes. Patient would like refill 4 her acyclovir. Has not had recent outbreak, but would like to have a prescription in case she does have an outbreak.  Lack of sexual desire: Patient continues to endorse lack of desire. Does occasionally have sex. Very rarely will have an orgasm. Not using any lubricants at this time. States that her and her husband have a good relationship they do have problems. States that they have never been to a therapist to discuss sexual concerns. States that she is in a safe relationship.     Review of Systems As per above.    Objective:   Physical Exam  Constitutional: She is oriented to person, place, and time. She appears well-developed and well-nourished.  HENT:  Head: Normocephalic and atraumatic.  Cardiovascular: Normal heart sounds.   Pulmonary/Chest: Effort normal. No respiratory distress.  Abdominal: Soft. She exhibits no distension. There is no tenderness.  Musculoskeletal: She exhibits no edema.  Neurological: She is alert and oriented to person, place, and time.  Skin: Skin is warm and dry. No rash noted.    Psychiatric: She has a normal mood and affect. Her behavior is normal.          Assessment & Plan:

## 2011-06-16 NOTE — Patient Instructions (Signed)
Eucerin Cream-- necesita usar 2 x al dia. Toma zyrtec daily.

## 2011-06-23 DIAGNOSIS — L299 Pruritus, unspecified: Secondary | ICD-10-CM | POA: Insufficient documentation

## 2011-06-23 MED ORDER — ACYCLOVIR 400 MG PO TABS: 400.0000 mg | ORAL_TABLET | Freq: Three times a day (TID) | ORAL | Status: AC

## 2011-06-23 NOTE — Assessment & Plan Note (Signed)
Unclear why pt is itching.  Most likely due to dry skin.  Pt to use dandruff shampoo on scalp and eucerin lotion bid on skin of extremities and torso to see if this relieves symptoms.  Pt also to use zyrtec daily.  May be skin sensitivity due to recent exposure to fragrence beads used in laundry. Pt to return if no improvement in symptoms.

## 2011-06-23 NOTE — Assessment & Plan Note (Signed)
Gave handout on decrease sexual desire in women.  Encouraged pt to talk with therapist about relationship and sexual concerns.  Also gave handout on current treatments.  We discussed that there are not good treatments at this time and the treatments we do have have side effects.  Pt to use ky lubricant with all sexual intercourse.

## 2011-06-23 NOTE — Assessment & Plan Note (Signed)
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  Pt reports h/o herpes.  No outbreak at this time.

## 2011-09-14 ENCOUNTER — Other Ambulatory Visit: Payer: Self-pay | Admitting: Family Medicine

## 2011-09-16 ENCOUNTER — Telehealth: Payer: Self-pay | Admitting: Family Medicine

## 2011-09-16 NOTE — Telephone Encounter (Signed)
Pt called and requested refill for Thyroid medicine, and if you can send it to her pharmacy.  Marines

## 2011-09-21 ENCOUNTER — Other Ambulatory Visit: Payer: Self-pay | Admitting: Family Medicine

## 2011-09-21 MED ORDER — SYNTHROID 175 MCG PO TABS: 175.0000 ug | ORAL_TABLET | Freq: Every day | ORAL | Status: DC

## 2011-09-21 NOTE — Telephone Encounter (Signed)
Please call patient and let her know that I have sent in refill.  Thanks!  Macio Kissoon

## 2011-09-29 ENCOUNTER — Encounter: Payer: Self-pay | Admitting: Family Medicine

## 2011-09-29 ENCOUNTER — Ambulatory Visit (INDEPENDENT_AMBULATORY_CARE_PROVIDER_SITE_OTHER): Payer: Self-pay | Admitting: Family Medicine

## 2011-09-29 VITALS — BP 118/78 | HR 80 | Ht 60.0 in | Wt 182.0 lb

## 2011-09-29 DIAGNOSIS — I10 Essential (primary) hypertension: Secondary | ICD-10-CM

## 2011-09-29 DIAGNOSIS — Z639 Problem related to primary support group, unspecified: Secondary | ICD-10-CM

## 2011-09-29 DIAGNOSIS — F439 Reaction to severe stress, unspecified: Secondary | ICD-10-CM | POA: Insufficient documentation

## 2011-09-29 DIAGNOSIS — E018 Other iodine-deficiency related thyroid disorders and allied conditions: Secondary | ICD-10-CM

## 2011-09-29 DIAGNOSIS — E663 Overweight: Secondary | ICD-10-CM

## 2011-09-29 LAB — BASIC METABOLIC PANEL
CO2: 25 mEq/L (ref 19–32)
Chloride: 105 mEq/L (ref 96–112)
Glucose, Bld: 84 mg/dL (ref 70–99)
Potassium: 4.3 mEq/L (ref 3.5–5.3)
Sodium: 139 mEq/L (ref 135–145)

## 2011-09-29 LAB — LIPID PANEL
HDL: 47 mg/dL (ref >39)
Total CHOL/HDL Ratio: 3.9 Ratio
VLDL: 24 mg/dL (ref 0–40)

## 2011-09-29 MED ORDER — LISINOPRIL 5 MG PO TABS: 5.0000 mg | ORAL_TABLET | Freq: Every day | ORAL | Status: DC

## 2011-09-29 MED ORDER — HYDROCHLOROTHIAZIDE 25 MG PO TABS: 25.0000 mg | ORAL_TABLET | Freq: Every day | ORAL | Status: DC

## 2011-09-29 MED ORDER — SYNTHROID 175 MCG PO TABS: 175.0000 ug | ORAL_TABLET | Freq: Every day | ORAL | Status: DC

## 2011-09-29 NOTE — Patient Instructions (Signed)
1)pression alto: 118/78- siga tomando los medicinas.  2)Thiroide- siga tomando la medicina.  Yo voy enviar los [employer] correo.   3) peso saludable recuerda-ejercicio es [employer].  recuerda- el metedo de plato.

## 2011-09-29 NOTE — Assessment & Plan Note (Addendum)
Encouraged patient to seek out therapy.  Encouarged pt to call Nelva Bush, CSW for possible therapy and resources for referral outside of clinic to spanish speaking therapist. Pt states understanding.  Passed PHQ-2.

## 2011-09-29 NOTE — Assessment & Plan Note (Signed)
Will recheck TSH today.  Refill given on rx for next year.  Will call pt if any adjustments need to be made, otherwise will mail results.

## 2011-09-29 NOTE — Assessment & Plan Note (Signed)
bp well controlled.  Will recheck bmet today.  Year supply of refills for hctz and lisinopril given. Encouraged weight loss.

## 2011-09-29 NOTE — Progress Notes (Signed)
  Subjective:    Patient ID: Judith Kelly, female    DOB: Dec 10, 1970, 41 y.o.   MRN: [id number]  HPI Blood pressure: BP 118/78.  Doing well with lisinopril and HCTZ.  No side effect. No dry cough.  No dizziness.   Hypothyroidism (s/p radioactive iodine): Taking medication daily.  Has days that she has low energy and other days that she feels good.  Sleeping well.  Enjoys daily activities.  No heat or cold intolerance. No problems with bowels.   History of afib: --Had lots of symptoms before radioactive iodine for her hyperthyroidism. + palpitations and sob-- found to have a fib and found to be caused by hyperthyroidims.  Now very rare palpitations.  No sob.  Regular heart rhythm on all exams.  Pt states she has had no problems with afib since treatment of hypertyroid.   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  At times feels very tired and sometimes tearful. Lots of responsibility in the house.  Husband not helping outside of work.     Review of Systems As per above.     Objective:   Physical Exam  Constitutional: She appears well-developed and well-nourished. No distress.       obese  HENT:  Head: Normocephalic and atraumatic.  Neck: No thyromegaly present.  Cardiovascular: Normal rate, regular rhythm and normal heart sounds.   No murmur heard. Pulmonary/Chest: Effort normal and breath sounds normal. No respiratory distress. She has no wheezes.  Abdominal: Soft. She exhibits no distension. There is no tenderness.  Musculoskeletal: Normal range of motion. She exhibits no edema.  Neurological: She is alert.  Skin: No rash noted.  Psychiatric: She has a normal mood and affect. Her behavior is normal.          Assessment & Plan:

## 2011-09-30 ENCOUNTER — Encounter: Payer: Self-pay | Admitting: Family Medicine

## 2011-09-30 LAB — TSH: TSH: 0.495 u[IU]/mL (ref 0.350–4.500)

## 2012-10-05 ENCOUNTER — Ambulatory Visit (INDEPENDENT_AMBULATORY_CARE_PROVIDER_SITE_OTHER): Payer: Self-pay | Admitting: Family Medicine

## 2012-10-05 ENCOUNTER — Encounter: Payer: Self-pay | Admitting: Family Medicine

## 2012-10-05 VITALS — BP 121/79 | HR 97 | Temp 99.6°F | Ht 60.0 in | Wt 188.0 lb

## 2012-10-05 DIAGNOSIS — J029 Acute pharyngitis, unspecified: Secondary | ICD-10-CM

## 2012-10-05 LAB — POCT RAPID STREP A (OFFICE): Rapid Strep A Screen: NEGATIVE

## 2012-10-05 MED ORDER — PENICILLIN V POTASSIUM 500 MG PO TABS: 500.0000 mg | ORAL_TABLET | Freq: Two times a day (BID) | ORAL | Status: DC

## 2012-10-05 NOTE — Patient Instructions (Addendum)
It was nice to meet you today. I'm sorry you are feeling so badly.  For your throat, your strep test was negative but because of your symptoms, we will go ahead and treat you with an antibiotic.  However, it may be viral, which the antibiotic won't help.  For the pain, keep using ibuprofen- use 600-800mg  3 times per day until you start feeling better.  You can also try throat sprays or salt water gargles to see if that helps.  Come back or go to the ER if it gets difficult to swallow, breath, or your voice gets very muffled.    Fue Optometrist. Lamento que te sientas tan mal.   Para su garganta, su prueba de estreptococos fue negativa, pero debido a sus sntomas, vamos a seguir adelante y te tratan con un antibitico. Sin embargo, puede ser viral, que el antibitico no ayudar.   Para el dolor, mantener el uso de ibuprofeno usar 600-800mg  3 veces al da [employer] comience a sentirse mejor.   Tambin puede tratar de [employer] para la garganta o las grgaras de agua con sal para ver si eso ayuda.   Vuelve o ir a la sala de emergencias si se pone difcil de tragar, respirar o su voz se vuelve muy amortiguado.

## 2012-10-05 NOTE — Progress Notes (Signed)
S: Pt comes in today for SDA for sore throat.  Visit done with the assistance of in house interpreter.  She reports that she has had significant throat pain for the past 2 days.  She also has had significant muscle aches/cramps since it started and has a very tender "ball" on the side of her neck on the right.  She is also having some R ear pain.  She has had fevers- the highest was 2 days ago when this all started, but she did not measure her temperature.  She has had chills and sweats.  Her nose started running today. She has not had any cough.  She has had some nausea but no vomiting, + loose stools/going to the bathroom frequently.  No known sick contacts.  She is able to swallow but it hurts.  She has tried ibuprofen for the fevers, which also helps the throat pain a little bit.  She did not get her flu shot this year.    ROS: Per HPI  History  Smoking status  . Never Smoker   Smokeless tobacco  . Never Used    O:  Filed Vitals:   10/05/12 0900  BP: 121/79  Pulse: 97  Temp: 99.6 F (37.6 C)    Gen: NAD but tearful at times HEENT: MMM, no scleral injection, significant pharyngeal erythema with tonsillar exudate, tender cervical LAD on right, + clear rhinorrhea, no petechia on palate, no strawberry tongue, TMs normal bilaterally  CV: RRR, no murmur Pulm: CTA bilat, no wheezes or crackles Ext: Warm, no rash   A/P: 42 y.o. female p/w pharyngitis  -See problem list -f/u in PRN

## 2012-10-05 NOTE — Assessment & Plan Note (Addendum)
Strep negative, but story and exam somewhat convincing-- pt very tearful with symptoms.  Will treat with PCN 500mg  BID x 10 days.  Pt aware this may be viral and abx will not help. Advised symptomatic treatment as well. Red flags for retropharyngeal abscess and/or epiglottitis discussed.

## 2012-10-12 ENCOUNTER — Ambulatory Visit (INDEPENDENT_AMBULATORY_CARE_PROVIDER_SITE_OTHER): Payer: Self-pay | Admitting: Family Medicine

## 2012-10-12 ENCOUNTER — Other Ambulatory Visit (HOSPITAL_COMMUNITY)
Admission: RE | Admit: 2012-10-12 | Discharge: 2012-10-12 | Disposition: A | Discharge status: Home / Self Care | Payer: Self-pay | Source: Ambulatory Visit | Attending: Family Medicine | Admitting: Family Medicine

## 2012-10-12 VITALS — BP 119/81 | HR 82 | Temp 97.7°F | Ht 60.0 in | Wt 186.5 lb

## 2012-10-12 DIAGNOSIS — R5381 Other malaise: Secondary | ICD-10-CM

## 2012-10-12 DIAGNOSIS — Z7189 Other specified counseling: Secondary | ICD-10-CM

## 2012-10-12 DIAGNOSIS — R6882 Decreased libido: Secondary | ICD-10-CM

## 2012-10-12 DIAGNOSIS — Z124 Encounter for screening for malignant neoplasm of cervix: Secondary | ICD-10-CM

## 2012-10-12 DIAGNOSIS — Z1151 Encounter for screening for human papillomavirus (HPV): Secondary | ICD-10-CM | POA: Insufficient documentation

## 2012-10-12 DIAGNOSIS — Z01419 Encounter for gynecological examination (general) (routine) without abnormal findings: Secondary | ICD-10-CM | POA: Insufficient documentation

## 2012-10-12 LAB — BASIC METABOLIC PANEL
BUN: 18 mg/dL (ref 6–23)
CO2: 25 mEq/L (ref 19–32)
Calcium: 9.6 mg/dL (ref 8.4–10.5)
Glucose, Bld: 96 mg/dL (ref 70–99)
Potassium: 4.5 mEq/L (ref 3.5–5.3)
Sodium: 138 mEq/L (ref 135–145)

## 2012-10-12 LAB — LIPID PANEL: LDL Cholesterol: 102 mg/dL — ABNORMAL HIGH (ref 0–99)

## 2012-10-12 LAB — CBC
HCT: 36.3 % (ref 36.0–46.0)
MCHC: 31.7 g/dL (ref 30.0–36.0)
MCV: 70.3 fL — ABNORMAL LOW (ref 78.0–100.0)
Platelets: 383 10*3/uL (ref 150–400)
RDW: 17.6 % — ABNORMAL HIGH (ref 11.5–15.5)

## 2012-10-12 NOTE — Assessment & Plan Note (Addendum)
Check Pap smear today. No history of abnormal Pap smears.  She was advised to call [facility] schedule [facility] when orange card approved. She already has an appointment with Britta Mccreedy.  She endorses fatigue. We will check TSH and CBC to rule-out anemia. We will also check vitamin D.  Check K and Cr as well with her being on diuretics and ACEi. Follow-up in 1 year.

## 2012-10-12 NOTE — Patient Instructions (Addendum)
Si los [employer], voy a Agricultural consultant carta con [facility]. Si esta abnormal, voy a llamar a usted.   Mucho gusto.  Regrese a la clinica para la proxima fisico en una ano.   Cuando tiene la tarjeta de [place], [place] [phone] para hacer una cita para Judith Kelly.

## 2012-10-12 NOTE — Progress Notes (Signed)
  Subjective:    Patient ID: Judith Kelly, female    DOB: [birth year], 42 y.o.   MRN: [id number]  HPI # Preventative February 2011 Pap WNL. She would like Pap today.  She would also like vitamin D level checked because she feels tired  She would like her thyroid checked as well  She is concerned about her kidneys because of her history of high blood pressure.   She reports compliance with her antihypertensives and thyroid medication.    Endorses usually regular menses every month Sexually active with husband only  Review of Systems Denies chest pain, difficulty breathing, abnormal vaginal discharge or irritation, dizziness  Allergies, medication, past medical history reviewed.  Smoking status noted.  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 She is married.  Significant for: HTN Hypothyroidism p radioactive iodine    Objective:   Physical Exam GEN: NAD; overweight PSYCH: pleasant CV: RRR, no m/r/g PULM: NI WOB; CTAB without w/r/r ABD: soft, NT, ND GU:    Vulva: normal   Vagina: normal, non-tender, thin white vaginal discharge   Cervix: no lesions EXT: no edema    Assessment & Plan:

## 2012-10-13 LAB — VITAMIN D 25 HYDROXY (VIT D DEFICIENCY, FRACTURES): Vit D, 25-Hydroxy: 43 ng/mL (ref 30–89)

## 2012-10-16 ENCOUNTER — Telehealth: Payer: Self-pay | Admitting: Family Medicine

## 2012-10-16 DIAGNOSIS — Z7189 Other specified counseling: Secondary | ICD-10-CM

## 2012-10-16 DIAGNOSIS — R5383 Other fatigue: Secondary | ICD-10-CM

## 2012-10-16 DIAGNOSIS — R5381 Other malaise: Secondary | ICD-10-CM | POA: Insufficient documentation

## 2012-10-16 MED ORDER — FERROUS SULFATE 325 (65 FE) MG PO TABS: 325.0000 mg | ORAL_TABLET | Freq: Two times a day (BID) | ORAL | Status: DC

## 2012-10-16 NOTE — Telephone Encounter (Signed)
LVM notifying. normal labs except anemic likely due to periods.  Recommend iron twice daily and follow-up with PCP in 1-2 months to discuss fatigue which may be partially/fully due to anemia.

## 2012-10-16 NOTE — Assessment & Plan Note (Signed)
See above regarding treatment for mild microcytic anemia likely secondary to periods

## 2012-10-16 NOTE — Assessment & Plan Note (Signed)
Telephone.  LVM notifying. normal labs except anemic likely due to periods.  Recommend iron twice daily and follow-up with PCP in 1-2 months to discuss fatigue which may be partially/fully due to anemia.

## 2012-10-18 ENCOUNTER — Ambulatory Visit: Payer: Self-pay | Admitting: Family Medicine

## 2012-10-19 ENCOUNTER — Encounter: Payer: Self-pay | Admitting: Family Medicine

## 2012-10-20 ENCOUNTER — Other Ambulatory Visit: Payer: Self-pay | Admitting: *Deleted

## 2012-10-20 DIAGNOSIS — E039 Hypothyroidism, unspecified: Secondary | ICD-10-CM

## 2012-10-20 MED ORDER — SYNTHROID 175 MCG PO TABS
175.0000 ug | ORAL_TABLET | Freq: Every day | ORAL | Status: DC
Start: 2012-10-20 — End: 2012-11-26

## 2012-10-20 NOTE — Telephone Encounter (Signed)
As a mention to you this pt came to see Dr. Madolyn Frieze for her CPE but pt didn't receive the refill for Synthroid. Could you please send a Rx to [facility] on [address].  Thank You   Marines

## 2012-11-26 ENCOUNTER — Encounter (HOSPITAL_COMMUNITY): Payer: Self-pay | Admitting: *Deleted

## 2012-11-26 ENCOUNTER — Emergency Department (HOSPITAL_COMMUNITY)
Admission: EM | Admit: 2012-11-26 | Discharge: 2012-11-26 | Disposition: A | Discharge status: Home / Self Care | Payer: No Typology Code available for payment source | Attending: Emergency Medicine | Admitting: Emergency Medicine

## 2012-11-26 ENCOUNTER — Emergency Department (HOSPITAL_COMMUNITY): Payer: No Typology Code available for payment source

## 2012-11-26 DIAGNOSIS — Z79899 Other long term (current) drug therapy: Secondary | ICD-10-CM | POA: Insufficient documentation

## 2012-11-26 DIAGNOSIS — I4891 Unspecified atrial fibrillation: Secondary | ICD-10-CM | POA: Insufficient documentation

## 2012-11-26 DIAGNOSIS — N209 Urinary calculus, unspecified: Secondary | ICD-10-CM

## 2012-11-26 DIAGNOSIS — N201 Calculus of ureter: Secondary | ICD-10-CM | POA: Insufficient documentation

## 2012-11-26 DIAGNOSIS — J309 Allergic rhinitis, unspecified: Secondary | ICD-10-CM | POA: Insufficient documentation

## 2012-11-26 DIAGNOSIS — Z87442 Personal history of urinary calculi: Secondary | ICD-10-CM | POA: Insufficient documentation

## 2012-11-26 LAB — COMPREHENSIVE METABOLIC PANEL
ALT: 32 U/L (ref 0–35)
Alkaline Phosphatase: 110 U/L (ref 39–117)
BUN: 17 mg/dL (ref 6–23)
CO2: 28 mEq/L (ref 19–32)
Calcium: 9.5 mg/dL (ref 8.4–10.5)
GFR calc Af Amer: >90 mL/min (ref >90)
GFR calc non Af Amer: >90 mL/min (ref >90)
Glucose, Bld: 99 mg/dL (ref 70–99)
Potassium: 3.8 mEq/L (ref 3.5–5.1)
Total Protein: 8 g/dL (ref 6.0–8.3)

## 2012-11-26 LAB — CBC WITH DIFFERENTIAL/PLATELET
Eosinophils Absolute: 0.2 10*3/uL (ref 0.0–0.7)
Eosinophils Relative: 3 % (ref 0–5)
HCT: 38.3 % (ref 36.0–46.0)
Hemoglobin: 12.5 g/dL (ref 12.0–15.0)
Lymphocytes Relative: 37 % (ref 12–46)
Lymphs Abs: 2.7 10*3/uL (ref 0.7–4.0)
MCH: 25.3 pg — ABNORMAL LOW (ref 26.0–34.0)
MCV: 77.4 fL — ABNORMAL LOW (ref 78.0–100.0)
Monocytes Relative: 6 % (ref 3–12)
RBC: 4.95 MIL/uL (ref 3.87–5.11)
WBC: 7.3 10*3/uL (ref 4.0–10.5)

## 2012-11-26 LAB — URINE MICROSCOPIC-ADD ON

## 2012-11-26 LAB — URINALYSIS, ROUTINE W REFLEX MICROSCOPIC
Bilirubin Urine: NEGATIVE
Bilirubin Urine: NEGATIVE
Glucose, UA: NEGATIVE mg/dL
Ketones, ur: NEGATIVE mg/dL
Leukocytes, UA: NEGATIVE
Nitrite: NEGATIVE
Nitrite: NEGATIVE
Protein, ur: NEGATIVE mg/dL
Protein, ur: NEGATIVE mg/dL
Specific Gravity, Urine: 1.021 (ref 1.005–1.030)
Specific Gravity, Urine: 1.024 (ref 1.005–1.030)
Urobilinogen, UA: 1 mg/dL (ref 0.0–1.0)
Urobilinogen, UA: 1 mg/dL (ref 0.0–1.0)
pH: 6 (ref 5.0–8.0)

## 2012-11-26 LAB — LIPASE, BLOOD: Lipase: 47 U/L (ref 11–59)

## 2012-11-26 MED ORDER — TAMSULOSIN HCL 0.4 MG PO CAPS: 0.4000 mg | ORAL_CAPSULE | Freq: Every day | ORAL | Status: DC

## 2012-11-26 MED ORDER — HYDROCODONE-ACETAMINOPHEN 5-325 MG PO TABS: 1.0000 | ORAL_TABLET | Freq: Four times a day (QID) | ORAL | Status: DC | PRN

## 2012-11-26 MED ORDER — ONDANSETRON 8 MG PO TBDP: 8.0000 mg | ORAL_TABLET | Freq: Three times a day (TID) | ORAL | Status: DC | PRN

## 2012-11-26 NOTE — ED Notes (Signed)
Pt in c/o right flank pain, states pain is intermittent, first started this morning and comes on suddenly, increased this evening, denies n/v

## 2012-11-26 NOTE — ED Provider Notes (Signed)
History     CSN: [id number]  Arrival date & time 11/26/12  1730   First MD Initiated Contact with Patient 11/26/12 1916      Chief Complaint  Patient presents with  . Flank Pain    (Consider location/radiation/quality/duration/timing/severity/associated sxs/prior treatment) HPI Comments: Pt comes in with cc of flank pain. Pt has hx of renal stone when she was a child. She states that she started having right sided flank pain starting today, about 2-3 episodes of sharp, severe pain, lasting about 10 minutes and then resolving. No n/v/f/c/uti like sx. No hx of pelvic disorder, no vaginal discharge, bleeding.   Patient is a 42 y.o. female presenting with flank pain. The history is provided by the patient.  Flank Pain Pertinent negatives include no chest pain, no abdominal pain and no shortness of breath.    Past Medical History  Diagnosis Date  . ALLERGIC RHINITIS 10/04/2008  . FIBRILLATION, ATRIAL 04/05/2007    Annotation: 06/25/04, transient atrial fibrillation secondary to  hyperthyroidism; resolved  Qualifier: History of  By: Sharen Hones  MD, Wynona Canes    . Rosacea 10/15/2006    Qualifier: Diagnosis of  By: Barbaraann Barthel MD, [name]      History reviewed. No pertinent past surgical history.  History reviewed. No pertinent family history.  History  Substance Use Topics  . Smoking status: Never Smoker   . Smokeless tobacco: Never Used  . Alcohol Use: Not on file    OB History   Grav Para Term Preterm Abortions TAB SAB Ect Mult Living                  Review of Systems  Constitutional: Negative for chills, activity change and fatigue.  Respiratory: Negative for cough, shortness of breath and wheezing.   Cardiovascular: Negative for chest pain.  Gastrointestinal: Negative for nausea, vomiting, abdominal pain, diarrhea, constipation, blood in stool and abdominal distention.  Genitourinary: Positive for flank pain. Negative for dysuria, hematuria and difficulty urinating.  Skin:  Negative for color change.  Neurological: Negative for speech difficulty.  Hematological: Does not bruise/bleed easily.  Psychiatric/Behavioral: Negative for confusion.    Allergies  Review of patient's allergies indicates no known allergies.  Home Medications   Current Outpatient Rx  Name  Route  Sig  Dispense  Refill  . ferrous sulfate 325 (65 FE) MG tablet   Oral   Take 1 tablet (325 mg total) by mouth 2 (two) times daily.   60 tablet   2   . levothyroxine (SYNTHROID, LEVOTHROID) 175 MCG tablet   Oral   Take 175 mcg by mouth every evening.         Marland Kitchen lisinopril (PRINIVIL,ZESTRIL) 5 MG tablet   Oral   Take 1 tablet (5 mg total) by mouth daily.   90 tablet   4   . HYDROcodone-acetaminophen (NORCO/VICODIN) 5-325 MG per tablet   Oral   Take 1 tablet by mouth every 6 (six) hours as needed for pain.   15 tablet   0   . ondansetron (ZOFRAN ODT) 8 MG disintegrating tablet   Oral   Take 1 tablet (8 mg total) by mouth every 8 (eight) hours as needed for nausea.   20 tablet   0   . tamsulosin (FLOMAX) 0.4 MG CAPS   Oral   Take 1 capsule (0.4 mg total) by mouth daily.   7 capsule   0     BP 127/81  Pulse 61  Temp(Src) 97.9 F (36.6 C) (Oral)  Resp 18  Wt 184 lb (83.462 kg)  BMI 35.94 kg/m2  SpO2 100%  LMP 11/12/2012  Physical Exam  Nursing note and vitals reviewed. Constitutional: She is oriented to person, place, and time. She appears well-developed and well-nourished.  HENT:  Head: Normocephalic and atraumatic.  Eyes: EOM are normal. Pupils are equal, round, and reactive to light.  Neck: Neck supple.  Cardiovascular: Normal rate, regular rhythm and normal heart sounds.   No murmur heard. Pulmonary/Chest: Effort normal. No respiratory distress.  Abdominal: Soft. She exhibits no distension. There is no tenderness. There is no rebound and no guarding.  Genitourinary:  No Right sided CVA tenderness  Neurological: She is alert and oriented to person,  place, and time.  Skin: Skin is warm and dry.    ED Course  Procedures (including critical care time)  Labs Reviewed  CBC WITH DIFFERENTIAL - Abnormal; Notable for the following:    MCV 77.4 (*)    MCH 25.3 (*)    RDW 19.6 (*)    All other components within normal limits  COMPREHENSIVE METABOLIC PANEL - Abnormal; Notable for the following:    Total Bilirubin 0.2 (*)    All other components within normal limits  URINALYSIS, ROUTINE W REFLEX MICROSCOPIC - Abnormal; Notable for the following:    APPearance CLOUDY (*)    Hgb urine dipstick MODERATE (*)    Leukocytes, UA SMALL (*)    All other components within normal limits  URINE MICROSCOPIC-ADD ON - Abnormal; Notable for the following:    Squamous Epithelial / LPF MANY (*)    Bacteria, UA MANY (*)    All other components within normal limits  URINALYSIS, ROUTINE W REFLEX MICROSCOPIC - Abnormal; Notable for the following:    Hgb urine dipstick TRACE (*)    All other components within normal limits  URINE MICROSCOPIC-ADD ON - Abnormal; Notable for the following:    Squamous Epithelial / LPF MANY (*)    Bacteria, UA MANY (*)    All other components within normal limits  URINE CULTURE  URINE CULTURE  LIPASE, BLOOD   Ct Abdomen Pelvis Wo Contrast  11/26/2012   *RADIOLOGY REPORT*  Clinical Data: Right-sided flank pain.  CT ABDOMEN AND PELVIS WITHOUT CONTRAST  Technique:  Multidetector CT imaging of the abdomen and pelvis was performed following the standard protocol without intravenous contrast.  Comparison: No priors.  Findings:  Lung Bases: Unremarkable.  Abdomen/Pelvis:  Image 51 of series 2 demonstrates a 4 mm calculus in the proximal third of the right ureter just beyond the right ureteral pelvic junction.  This is associated with only mild right- sided hydronephrosis.  No additional calculi are noted within the collecting system of either kidney, along the course of either ureter, or within the lumen of the urinary bladder.  The  unenhanced appearance of the kidneys is unremarkable bilaterally.  The unenhanced appearance of the liver, gallbladder, pancreas, spleen and bilateral adrenal glands is unremarkable.  Normal appendix.  No significant volume of ascites.  No pneumoperitoneum. No pathologic distension of small bowel.  Uterus and ovaries are unremarkable in appearance.  No definite pathologic lymphadenopathy identified within the abdomen or pelvis on today's noncontrast CT examination.  Musculoskeletal: There are no aggressive appearing lytic or blastic lesions noted in the visualized portions of the skeleton.  IMPRESSION: 1.  4 mm partially obstructing calculus in the proximal third of the right ureter just beyond the right ureteropelvic junction with mild right-sided hydronephrosis. 2.  Normal appendix.  Original Report Authenticated By: Trudie Reed, M.D.     1. Urolithiasis       MDM  Pt comes in with cc of right sided flank pain. Hx of renal stones. No uti like sx.  Based on hx and exam, we got a CT renal stone protocol - which shows a renal stone - about 4 mm. Pt's UA is clean.  We discussed return precautions and i provided Urology f/u.   Derwood Kaplan, MD 11/26/12 2351

## 2012-11-26 NOTE — ED Notes (Signed)
The patient is in the restroom providing another urine sample.

## 2012-11-26 NOTE — ED Notes (Signed)
The patient is AOx4 and comfortable with the discharge instructions. 

## 2012-11-28 LAB — URINE CULTURE: Colony Count: 80000

## 2012-12-01 ENCOUNTER — Emergency Department (HOSPITAL_COMMUNITY): Payer: No Typology Code available for payment source

## 2012-12-01 ENCOUNTER — Emergency Department (HOSPITAL_COMMUNITY)
Admission: EM | Admit: 2012-12-01 | Discharge: 2012-12-01 | Disposition: A | Discharge status: Home / Self Care | Payer: No Typology Code available for payment source | Attending: Emergency Medicine | Admitting: Emergency Medicine

## 2012-12-01 ENCOUNTER — Encounter (HOSPITAL_COMMUNITY): Payer: Self-pay | Admitting: *Deleted

## 2012-12-01 DIAGNOSIS — Z79899 Other long term (current) drug therapy: Secondary | ICD-10-CM | POA: Insufficient documentation

## 2012-12-01 DIAGNOSIS — E0590 Thyrotoxicosis, unspecified without thyrotoxic crisis or storm: Secondary | ICD-10-CM | POA: Insufficient documentation

## 2012-12-01 DIAGNOSIS — Z8709 Personal history of other diseases of the respiratory system: Secondary | ICD-10-CM | POA: Insufficient documentation

## 2012-12-01 DIAGNOSIS — Z3202 Encounter for pregnancy test, result negative: Secondary | ICD-10-CM | POA: Insufficient documentation

## 2012-12-01 DIAGNOSIS — Z8679 Personal history of other diseases of the circulatory system: Secondary | ICD-10-CM | POA: Insufficient documentation

## 2012-12-01 DIAGNOSIS — Z872 Personal history of diseases of the skin and subcutaneous tissue: Secondary | ICD-10-CM | POA: Insufficient documentation

## 2012-12-01 DIAGNOSIS — N200 Calculus of kidney: Secondary | ICD-10-CM

## 2012-12-01 LAB — URINALYSIS, ROUTINE W REFLEX MICROSCOPIC
Bilirubin Urine: NEGATIVE
Glucose, UA: NEGATIVE mg/dL
Protein, ur: NEGATIVE mg/dL
Urobilinogen, UA: 0.2 mg/dL (ref 0.0–1.0)

## 2012-12-01 LAB — URINE MICROSCOPIC-ADD ON

## 2012-12-01 LAB — CBC
HCT: 36.1 % (ref 36.0–46.0)
MCH: 25.4 pg — ABNORMAL LOW (ref 26.0–34.0)
MCV: 77.1 fL — ABNORMAL LOW (ref 78.0–100.0)
RBC: 4.68 MIL/uL (ref 3.87–5.11)
RDW: 18.9 % — ABNORMAL HIGH (ref 11.5–15.5)
WBC: 7.5 10*3/uL (ref 4.0–10.5)

## 2012-12-01 LAB — COMPREHENSIVE METABOLIC PANEL
BUN: 17 mg/dL (ref 6–23)
CO2: 26 mEq/L (ref 19–32)
Calcium: 9.1 mg/dL (ref 8.4–10.5)
Chloride: 103 mEq/L (ref 96–112)
Creatinine, Ser: 0.85 mg/dL (ref 0.50–1.10)
GFR calc non Af Amer: 83 mL/min — ABNORMAL LOW (ref >90)
Total Bilirubin: 0.2 mg/dL — ABNORMAL LOW (ref 0.3–1.2)

## 2012-12-01 LAB — POCT PREGNANCY, URINE: Preg Test, Ur: NEGATIVE

## 2012-12-01 MED ORDER — HYDROCODONE-ACETAMINOPHEN 10-325 MG PO TABS: 1.0000 | ORAL_TABLET | Freq: Four times a day (QID) | ORAL | Status: DC | PRN

## 2012-12-01 MED ORDER — HYDROMORPHONE HCL PF 1 MG/ML IJ SOLN
1.0000 mg | Freq: Once | INTRAMUSCULAR | Status: AC
  Administered 2012-12-01: 1 mg via INTRAVENOUS
  Filled 2012-12-01: qty 1

## 2012-12-01 MED ORDER — SODIUM CHLORIDE 0.9 % IV BOLUS (SEPSIS)
1000.0000 mL | Freq: Once | INTRAVENOUS | Status: AC
  Administered 2012-12-01: 1000 mL via INTRAVENOUS

## 2012-12-01 MED ORDER — KETOROLAC TROMETHAMINE 30 MG/ML IJ SOLN
30.0000 mg | Freq: Once | INTRAMUSCULAR | Status: AC
  Administered 2012-12-01: 30 mg via INTRAVENOUS
  Filled 2012-12-01: qty 1

## 2012-12-01 MED ORDER — ONDANSETRON HCL 4 MG/2ML IJ SOLN
4.0000 mg | Freq: Once | INTRAMUSCULAR | Status: AC
  Administered 2012-12-01: 4 mg via INTRAVENOUS
  Filled 2012-12-01: qty 2

## 2012-12-01 NOTE — ED Notes (Signed)
The pt has a history of a kidney stone since Saturday.  She has had more pain since yesterday.  lmp  May 10th

## 2012-12-01 NOTE — ED Provider Notes (Signed)
History     CSN: [id number]  Arrival date & time 12/01/12  1712   First MD Initiated Contact with Patient 12/01/12 1728      Chief Complaint  Patient presents with  . Flank Pain    (Consider location/radiation/quality/duration/timing/severity/associated sxs/prior treatment) HPI Patient presents with concern of ongoing abdominal pain.  Pain began last week, since onset has been progressive. She was seen here 5 days ago.  She was diagnosed with a right sided a 4 mm kidney stone. She states that since discharge she is taking her medication as directed, but continues to have increasing pain. She continues to urinate, denies any new vomiting, diarrhea, fever, chills, confusion.  Past Medical History  Diagnosis Date  . ALLERGIC RHINITIS 10/04/2008  . FIBRILLATION, ATRIAL 04/05/2007    Annotation: 06/25/04, transient atrial fibrillation secondary to  hyperthyroidism; resolved  Qualifier: History of  By: Sharen Hones  MD, Wynona Canes    . Rosacea 10/15/2006    Qualifier: Diagnosis of  By: Barbaraann Barthel MD, [name]      History reviewed. No pertinent past surgical history.  No family history on file.  History  Substance Use Topics  . Smoking status: Never Smoker   . Smokeless tobacco: Never Used  . Alcohol Use: No    OB History   Grav Para Term Preterm Abortions TAB SAB Ect Mult Living                  Review of Systems  Constitutional:       Per HPI, otherwise negative  HENT:       Per HPI, otherwise negative  Respiratory:       Per HPI, otherwise negative  Cardiovascular:       Per HPI, otherwise negative  Gastrointestinal: Negative for vomiting.  Endocrine:       Negative aside from HPI  Genitourinary:       Neg aside from HPI   Musculoskeletal:       Per HPI, otherwise negative  Skin: Negative.   Neurological: Negative for syncope.    Allergies  Review of patient's allergies indicates no known allergies.  Home Medications   Current Outpatient Rx  Name  Route  Sig   Dispense  Refill  . ferrous sulfate 325 (65 FE) MG tablet   Oral   Take 1 tablet (325 mg total) by mouth 2 (two) times daily.   60 tablet   2   . HYDROcodone-acetaminophen (NORCO/VICODIN) 5-325 MG per tablet   Oral   Take 1 tablet by mouth every 6 (six) hours as needed for pain.   15 tablet   0   . levothyroxine (SYNTHROID, LEVOTHROID) 175 MCG tablet   Oral   Take 175 mcg by mouth every evening.         Marland Kitchen lisinopril (PRINIVIL,ZESTRIL) 5 MG tablet   Oral   Take 1 tablet (5 mg total) by mouth daily.   90 tablet   4   . ondansetron (ZOFRAN ODT) 8 MG disintegrating tablet   Oral   Take 1 tablet (8 mg total) by mouth every 8 (eight) hours as needed for nausea.   20 tablet   0   . tamsulosin (FLOMAX) 0.4 MG CAPS   Oral   Take 1 capsule (0.4 mg total) by mouth daily.   7 capsule   0     BP 156/96  Pulse 74  Temp(Src) 97.6 F (36.4 C) (Oral)  Resp 20  SpO2 100%  LMP 11/12/2012  Physical  Exam  Nursing note and vitals reviewed. Constitutional: She is oriented to person, place, and time. She appears well-developed and well-nourished. No distress.  HENT:  Head: Normocephalic and atraumatic.  Eyes: Conjunctivae and EOM are normal.  Cardiovascular: Normal rate and regular rhythm.   Pulmonary/Chest: Effort normal and breath sounds normal. No stridor. No respiratory distress.  Abdominal: She exhibits no distension. There is tenderness in the right lower quadrant. There is guarding and CVA tenderness. There is no rigidity.  Musculoskeletal: She exhibits no edema.  Neurological: She is alert and oriented to person, place, and time. No cranial nerve deficit.  Skin: Skin is warm and dry.  Psychiatric: She has a normal mood and affect.    ED Course  Procedures (including critical care time)  Labs Reviewed  URINALYSIS, ROUTINE W REFLEX MICROSCOPIC  CBC  COMPREHENSIVE METABOLIC PANEL   No results found.   No diagnosis found.  I reviewed the patient's chart,  including CT study from several days ago.  7:58 PM Patient ambulatory.  She appears better, states that she feels better.  Results unremarkable.  MDM  This patient presents with abdominal pain.  Notably, the patient has no fever, nausea or vomiting, dysuria, overt evidence of infection.  Nor, does she have any leukocytosis, or evidence of a urinary tract infection.  With improved analgesics, the patient was discharged in stable condition to follow up with urology after the weekend.        Gerhard Munch, MD 12/01/12 2014

## 2012-12-01 NOTE — ED Notes (Signed)
No new changes from triage assessment family at [facility]

## 2012-12-01 NOTE — ED Notes (Signed)
Patient nauseated with vomiting, PA informed medication ordered for nausea

## 2012-12-04 ENCOUNTER — Ambulatory Visit (INDEPENDENT_AMBULATORY_CARE_PROVIDER_SITE_OTHER): Payer: No Typology Code available for payment source | Admitting: Family Medicine

## 2012-12-04 ENCOUNTER — Encounter: Payer: Self-pay | Admitting: Family Medicine

## 2012-12-04 VITALS — BP 127/83 | HR 65 | Temp 98.4°F | Ht 60.0 in | Wt 184.0 lb

## 2012-12-04 DIAGNOSIS — N200 Calculus of kidney: Secondary | ICD-10-CM

## 2012-12-04 MED ORDER — TAMSULOSIN HCL 0.4 MG PO CAPS: 0.4000 mg | ORAL_CAPSULE | Freq: Every day | ORAL | Status: DC

## 2012-12-04 MED ORDER — HYDROCODONE-ACETAMINOPHEN 10-325 MG PO TABS: 1.0000 | ORAL_TABLET | Freq: Four times a day (QID) | ORAL | Status: DC | PRN

## 2012-12-04 NOTE — Patient Instructions (Addendum)
Vamos a referir a Microbiologist cita por 1-2 semanas

## 2012-12-14 NOTE — Progress Notes (Signed)
  Subjective:    Patient ID: Judith Kelly, female    DOB: 06-Jun-1971, 42 y.o.   MRN: [id number]  HPI Renal lithiasis - diagnosed by CT in ER 11/26/12. The right sided pain persists despite continuing the Tamsulosin. She would like a urology referral. No medical insurance to cover that   Review of Systems     Objective:   Physical Exam  Constitutional: She appears well-developed and well-nourished.  Mildly uncomfortable appearing  Neurological: She is alert.  Psychiatric: She has a normal mood and affect. Her behavior is normal.          Assessment & Plan:

## 2013-03-26 ENCOUNTER — Ambulatory Visit (INDEPENDENT_AMBULATORY_CARE_PROVIDER_SITE_OTHER): Payer: No Typology Code available for payment source | Admitting: Family Medicine

## 2013-03-26 ENCOUNTER — Encounter: Payer: Self-pay | Admitting: Family Medicine

## 2013-03-26 VITALS — BP 128/80 | HR 66 | Temp 98.3°F | Ht 60.0 in | Wt 188.0 lb

## 2013-03-26 DIAGNOSIS — M25562 Pain in left knee: Secondary | ICD-10-CM

## 2013-03-26 DIAGNOSIS — I10 Essential (primary) hypertension: Secondary | ICD-10-CM

## 2013-03-26 DIAGNOSIS — M25569 Pain in unspecified knee: Secondary | ICD-10-CM

## 2013-03-26 NOTE — Assessment & Plan Note (Signed)
The patient has been noncompliant with blood pressure medications, however has a normal blood pressure. Therefore she will discontinue lisinopril this time. We will continue to trend her blood pressures and treat accordingly.

## 2013-03-26 NOTE — Patient Instructions (Signed)
Fue un Arboriculturist.   Hipertensin - Usted no tiene que tomar lisinopril ms. Comprobaremos la presin arterial en la prxima visita para asegurarse de que es normal.   Migraa - Puede tomar 3 tabletas de aspirina cuando el dolor de [place] comienza. Adems, si usted necesita el ibuprofeno, por favor tome 4-6 horas ms tarde.   Dolor de rodilla - Usted tiene inflamacin del menisco. Usted puede utilizar ibuprofeno tres veces al da segn sea necesario. Por favor, tmelo con alimentos.   Regreso en un mes para el dolor de rodilla si no es mejor.    Atentamente,

## 2013-03-26 NOTE — Assessment & Plan Note (Signed)
Chronic knee pain without evidence of meniscal tear or ligamentous injury. Likely has mild degenerative joint disease worsened by obesity. Patient was instructed to use low-dose NSAIDs as needed and to continue exercising. She is agreeable with this plan.

## 2013-03-26 NOTE — Progress Notes (Signed)
  Subjective:    Patient ID: Judith Kelly, female    DOB: Aug 03, 1970, 42 y.o.   MRN: [id number]  HPI Visit conducted in Spanish   1. Knee Pain:   > Laterality: left medial side > Onset: acute on chronic > Course: relapsing  > Character: sore on medial left knee after walking, does not radiate > Inciting Event: none > Associated Symptoms: no swelling, redness, catching, locking or falls > Alleviating: nothing tried > Exacerbating: walking > Hx of injury: no > Hx of imaging: no  2. Hypertension  Home BP monitoring: no  Office BP: BP Readings from Last 3 Encounters:  03/26/13 128/80  12/04/12 127/83  12/01/12 121/80    Prescribed meds: lisinopril 5 mg daily for > 1 year  Hypertension ROS:  Taking medications as prescribed: No - patient ran out of medication a month ago and has not been taking any Chest pain: No Shortness of breath: No Swelling of extremities: No TIA symptoms: No     Review of Systems Positive for migraine headaches on the right side and intermittent blurry vision on the right Otherwise negative     Objective:   Physical Exam BP 128/80  Pulse 66  Temp(Src) 98.3 F (36.8 C) (Oral)  Ht 5' (1.524 m)  Wt 188 lb (85.276 kg)  BMI 36.72 kg/m2  LMP 02/24/2013 Gen: well appearing CV: RRR, no murmurs Extremities: no edema  Knee Exam:  Laterality: left Appearance: normal Edema: no   Tenderness: no   Range of Motion: > Passive Extension: normal Flexion:normal > Active Extension: normal Flexion: normal Laxity: no lateral or medical laxity Maneuvers: > Lachman's: negative > Grind: negative  > Posterior Drawer: negative > Patellar Compression: negative Strength:  > Quadricep: 5/5 > Hamstring: 5/5 Gait: normal        Assessment & Plan:

## 2013-05-08 ENCOUNTER — Ambulatory Visit: Payer: No Typology Code available for payment source

## 2013-06-28 ENCOUNTER — Encounter: Payer: Self-pay | Admitting: Family Medicine

## 2013-06-28 ENCOUNTER — Ambulatory Visit (INDEPENDENT_AMBULATORY_CARE_PROVIDER_SITE_OTHER): Payer: No Typology Code available for payment source | Admitting: Family Medicine

## 2013-06-28 VITALS — BP 150/102 | HR 80 | Ht 60.0 in | Wt 193.0 lb

## 2013-06-28 DIAGNOSIS — J029 Acute pharyngitis, unspecified: Secondary | ICD-10-CM

## 2013-06-28 DIAGNOSIS — K056 Periodontal disease, unspecified: Secondary | ICD-10-CM

## 2013-06-28 DIAGNOSIS — K068 Other specified disorders of gingiva and edentulous alveolar ridge: Secondary | ICD-10-CM

## 2013-06-28 DIAGNOSIS — K047 Periapical abscess without sinus: Secondary | ICD-10-CM

## 2013-06-28 DIAGNOSIS — J020 Streptococcal pharyngitis: Secondary | ICD-10-CM

## 2013-06-28 DIAGNOSIS — K069 Disorder of gingiva and edentulous alveolar ridge, unspecified: Secondary | ICD-10-CM

## 2013-06-28 LAB — POCT RAPID STREP A (OFFICE): RAPID STREP A SCREEN: NEGATIVE

## 2013-06-28 MED ORDER — PENICILLIN V POTASSIUM 500 MG PO TABS: 500.0000 mg | ORAL_TABLET | Freq: Four times a day (QID) | ORAL | Status: DC

## 2013-06-28 NOTE — Progress Notes (Signed)
Patient ID: Judith Kelly    DOB: [birth year], 43 y.o.   MRN: [id number] --- Subjective:  Judith MalkinOralia is a 43 y.o.female who presents with sore throat as well as lesion on right upper gum.  - sore throat: started 2 days ago. No cough. No congestion. No rhinorrhea. Ha shad some myalgias and feeling warm. No documented fevers. Able to swallow fluids and solids. Some discomfort with swallowing saliva. Has tried ibuprofen which has helped some  - sore on gum: noticed it on December 24th. Located on right upper gum. Hurts to touch the lip over it. Able to eat. No recent weight loss. No fevers, no chills.   ROS: see HPI Past Medical History: reviewed and updated medications and allergies. Social History: Tobacco: none, never smoker  Objective: Filed Vitals:   06/28/13 1704  BP: 150/102  Pulse:     Physical Examination:   General appearance - alert, well appearing, and in no distress Ears - bilateral TM's and external ear canals normal Nose -mildly erythematous and congested bilaterally Mouth - erythematous oropharynx and tonsils, no tonsillar exudates appreciated Gum: upper gum on right side of midline, right above right front incisor: small irregular indurated mass (0.5cm in size), tender to touch Neck - supple, no significant adenopathy Chest - clear to auscultation, no wheezes, rales or rhonchi, symmetric air entry Heart - normal rate, regular rhythm, normal S1, S2, no murmurs

## 2013-06-28 NOTE — Patient Instructions (Addendum)
We are going to treat the lesion on the gum with penicillin antibiotics for 10 days and see if this helps. Please follow up with your doctor in 2 weeks to see if it has gotten better.   For the sore throat, use salt water rinses. You can take ibuprofen 600mg  every 6 hrs as needed for the sore throat.   Faringitis (Pharyngitis) La faringitis ocurre cuando la faringe presenta enrojecimiento, dolor e hinchazn (inflamacin).  CAUSAS  Normalmente, la faringitis se debe a una infeccin. Generalmente, estas infecciones ocurren debido a virus (viral) y se presentan cuando las personas se resfran. Sin embargo, a Advertising account executiveveces la faringitis es provocada por bacterias (bacteriana). Las alergias tambin pueden ser una causa de la faringitis. La faringitis viral se puede contagiar de Neomia Dearuna persona a otra al toser, estornudar y compartir objetos o utensilios personales (tazas, tenedores, cucharas, cepillos de diente). La faringitis bacteriana se puede contagiar de Neomia Dearuna persona a otra a travs de un contacto ms ntimo, como besar.  SIGNOS Y SNTOMAS  Los sntomas de la faringitis incluyen los siguientes:   Dolor de Advertising copywritergarganta.  Cansancio (fatiga).  Fiebre no muy elevada.  Dolor de [place].  Dolores musculares y en las articulaciones.  Erupciones cutneas  Ganglios linfticos hinchados.  Una pelcula parecida a las placas en la garganta o las amgdalas (frecuente con la faringitis bacteriana). DIAGNSTICO  El mdico le har preguntas sobre la enfermedad y sus sntomas. Normalmente, todo lo que se necesita para diagnosticar una faringitis son sus antecedentes mdicos y un examen fsico. A veces se realiza una prueba rpida para estreptococos. Tambin es posible que se realicen otros anlisis de laboratorio, segn la posible causa.  TRATAMIENTO  La faringitis viral normalmente mejorar en un plazo de 3 a 4das sin medicamentos. La faringitis bacteriana se trata con medicamentos que McGraw-Hillmatan los grmenes  (antibiticos).  INSTRUCCIONES PARA EL CUIDADO EN EL HOGAR   Beba gran cantidad de lquido para mantener la orina de tono claro o color amarillo plido.  Tome solo medicamentos de venta libre o recetados, segn las indicaciones del mdico.  Si le receta antibiticos, asegrese de terminarlos, incluso si comienza a Actorsentirse mejor.  No tome aspirina.  Descanse lo suficiente.  Hgase grgaras con 8onzas (227ml) de agua con sal (cucharadita de sal por litro de agua) cada 1 o 2horas para Science writercalmar la garganta.  Puede usar pastillas (si no corre riesgo de Health visitorahogarse) o aerosoles para Science writercalmar la garganta. SOLICITE ATENCIN MDICA SI:   Tiene bultos grandes y dolorosos en el cuello.  Tiene una erupcin cutnea.  Cuando tose elimina una expectoracin verde, amarillo amarronado o con [place]. SOLICITE ATENCIN MDICA DE INMEDIATO SI:   El cuello se pone rgido.  Comienza a babear o no puede tragar lquidos.  Vomita o no puede retener los American International Groupmedicamentos ni los lquidos.  Siente un dolor intenso que no se alivia con los medicamentos recomendados.  Tiene dificultades para respirar (y no debido a la nariz tapada). ASEGRESE DE QUE:   Comprende estas instrucciones.  Controlar su afeccin.  Recibir ayuda de inmediato si no mejora o si empeora. Document Released: 03/10/2005 Document Revised: 03/21/2013 [facility] Patient Information 2014 [place], [place].

## 2013-06-29 DIAGNOSIS — J029 Acute pharyngitis, unspecified: Secondary | ICD-10-CM | POA: Insufficient documentation

## 2013-06-29 DIAGNOSIS — K068 Other specified disorders of gingiva and edentulous alveolar ridge: Secondary | ICD-10-CM | POA: Insufficient documentation

## 2013-06-29 LAB — STREP A DNA PROBE: GASP: NEGATIVE

## 2013-06-29 NOTE — Assessment & Plan Note (Signed)
Ongoing for 3 weeks. Less likely to be herpetic lesion in that time frame. Possibly abscess. Malignancy very unlikely given her age and non smoker status.  - treat with penicillin V K for 10 days  - follow up in 2 weeks with PCP - if not better with antibiotics, consider panoramic dental xray to evaluate for any more deep seated abscess. If present, refer to oral surgery, if not and still concern of nature of lesion, refer to ENT for biopsy.

## 2013-06-29 NOTE — Assessment & Plan Note (Signed)
Rapid strep negative. Sent to culture as well. - treat symptomatically with ibuprofen and gargles. She is able to eat and drink so I don't think that a short steroid burst is warranted.  - red flags for return reviewed

## 2013-07-06 ENCOUNTER — Ambulatory Visit (INDEPENDENT_AMBULATORY_CARE_PROVIDER_SITE_OTHER): Payer: No Typology Code available for payment source | Admitting: Emergency Medicine

## 2013-07-06 ENCOUNTER — Encounter: Payer: Self-pay | Admitting: Emergency Medicine

## 2013-07-06 VITALS — BP 126/89 | HR 94 | Ht 60.0 in | Wt 190.9 lb

## 2013-07-06 DIAGNOSIS — I10 Essential (primary) hypertension: Secondary | ICD-10-CM

## 2013-07-06 DIAGNOSIS — K069 Disorder of gingiva and edentulous alveolar ridge, unspecified: Secondary | ICD-10-CM

## 2013-07-06 DIAGNOSIS — K068 Other specified disorders of gingiva and edentulous alveolar ridge: Secondary | ICD-10-CM

## 2013-07-06 DIAGNOSIS — K056 Periodontal disease, unspecified: Secondary | ICD-10-CM

## 2013-07-06 LAB — BASIC METABOLIC PANEL
BUN: 18 mg/dL (ref 6–23)
CALCIUM: 9.8 mg/dL (ref 8.4–10.5)
CO2: 30 mEq/L (ref 19–32)
CREATININE: 0.73 mg/dL (ref 0.50–1.10)
Chloride: 101 mEq/L (ref 96–112)
Glucose, Bld: 98 mg/dL (ref 70–99)
Potassium: 4.6 mEq/L (ref 3.5–5.3)
Sodium: 138 mEq/L (ref 135–145)

## 2013-07-06 MED ORDER — LISINOPRIL 5 MG PO TABS: 5.0000 mg | ORAL_TABLET | Freq: Every day | ORAL | Status: DC

## 2013-07-06 MED ORDER — HYDROCHLOROTHIAZIDE 25 MG PO TABS: 25.0000 mg | ORAL_TABLET | Freq: Every day | ORAL | Status: DC

## 2013-07-06 MED ORDER — VALACYCLOVIR HCL 1 G PO TABS: 1000.0000 mg | ORAL_TABLET | Freq: Two times a day (BID) | ORAL | Status: DC

## 2013-07-06 NOTE — Progress Notes (Signed)
   Subjective:    Patient ID: Judith Kelly, female    DOB: 1971-04-01, 43 y.o.   MRN: [id number]  HPI Judith PillingOralia Kelly is here for hypertension.  Hypertension Compliant with medication: yes - she has restarted her lisinopril and HCTZ that she was on previously; been back on the meds x1 week Side effects from medication: no Check BP at home: yes  BP ranges: remained 150/90 at home  Chest pain: no Palpitations: no Vision changes: no - will occasionally get some right blurry vision Leg edema: no Dizziness: no  Gum lesion She states the lesion on her gum from 8 days ago is still there.  It is improved but still painful.  She is still on the PCN.  Current Outpatient Prescriptions on File Prior to Visit  Medication Sig Dispense Refill  . levothyroxine (SYNTHROID, LEVOTHROID) 175 MCG tablet Take 175 mcg by mouth every evening.      . penicillin v potassium (VEETID) 500 MG tablet Take 1 tablet (500 mg total) by mouth 4 (four) times daily. Take for 10 days  40 tablet  0   No current facility-administered medications on file prior to visit.    I have reviewed and updated the following as appropriate: allergies and current medications SHx: non smoker   Review of Systems See HPI    Objective:   Physical Exam BP 126/89  Pulse 94  Ht 5' (1.524 m)  Wt 190 lb 14.4 oz (86.592 kg)  BMI 37.28 kg/m2  LMP 06/29/2013 Gen: alert, cooperative, NAD HEENT: AT/[place], sclera white, MMM Gum: 2mm area of erythema above right front tooth with a 1mm vesicle Neck: supple CV: RRR, no murmurs Pulm: CTAB, no wheezes or rales Ext: no edema, 2+ DP pulses bilaterally      Assessment & Plan:

## 2013-07-06 NOTE — Assessment & Plan Note (Signed)
Well controlled back on medication. Continue HCTZ 25mg  and lisinopril 5mg  daily. BMP today. F/u with PCP in 3 months.

## 2013-07-06 NOTE — Patient Instructions (Signed)
It was nice to see you!  The mouth spot - finish the antibiotics. - if it is not gone, pick up the Valtrex and take 1 pill twice a day for 3 days. - if it is still not gone, please come back so we can re-evaluate it  Blood pressure - I sent in the lisinopril and hctz to your pharmacy. - Follow up with your regular doctor in 3 months.

## 2013-07-06 NOTE — Assessment & Plan Note (Signed)
Improved based on previous exam documented. Appears more vesicular now. Will complete PCN.  It still present will pick up the valtrex and take a 3 days course. If still present after antibiotics and valtrex, she will return for additional evaluation - panorex vs dental referral.

## 2013-07-10 ENCOUNTER — Telehealth: Payer: Self-pay | Admitting: Family Medicine

## 2013-07-10 MED ORDER — ACYCLOVIR 200 MG PO CAPS: 200.0000 mg | ORAL_CAPSULE | Freq: Every day | ORAL | Status: DC

## 2013-07-10 NOTE — Telephone Encounter (Signed)
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 I sent a prescription for this. Please let the patient know.

## 2013-07-10 NOTE — Telephone Encounter (Signed)
Will fwd to MD.  Sibbie Flammia L, CMA  

## 2013-07-10 NOTE — Telephone Encounter (Signed)
Pt called requesting PCP change valACYclovir (VALTREX) 1000 MG tablet because it cost $200.  Thank You   Marines

## 2013-07-13 NOTE — Telephone Encounter (Signed)
Pt is aware. ° °Marines  °

## 2013-10-18 ENCOUNTER — Encounter: Payer: Self-pay | Admitting: Family Medicine

## 2013-10-18 ENCOUNTER — Ambulatory Visit (INDEPENDENT_AMBULATORY_CARE_PROVIDER_SITE_OTHER): Payer: No Typology Code available for payment source | Admitting: Family Medicine

## 2013-10-18 ENCOUNTER — Other Ambulatory Visit: Payer: Self-pay | Admitting: Family Medicine

## 2013-10-18 VITALS — BP 123/83 | HR 76 | Ht 60.0 in | Wt 197.8 lb

## 2013-10-18 DIAGNOSIS — E018 Other iodine-deficiency related thyroid disorders and allied conditions: Secondary | ICD-10-CM

## 2013-10-18 DIAGNOSIS — R5383 Other fatigue: Secondary | ICD-10-CM

## 2013-10-18 DIAGNOSIS — R5381 Other malaise: Secondary | ICD-10-CM

## 2013-10-18 DIAGNOSIS — E039 Hypothyroidism, unspecified: Secondary | ICD-10-CM

## 2013-10-18 DIAGNOSIS — E611 Iron deficiency: Secondary | ICD-10-CM | POA: Insufficient documentation

## 2013-10-18 DIAGNOSIS — K069 Disorder of gingiva and edentulous alveolar ridge, unspecified: Secondary | ICD-10-CM

## 2013-10-18 DIAGNOSIS — K1370 Unspecified lesions of oral mucosa: Secondary | ICD-10-CM

## 2013-10-18 DIAGNOSIS — K056 Periodontal disease, unspecified: Secondary | ICD-10-CM

## 2013-10-18 DIAGNOSIS — I10 Essential (primary) hypertension: Secondary | ICD-10-CM

## 2013-10-18 DIAGNOSIS — K068 Other specified disorders of gingiva and edentulous alveolar ridge: Secondary | ICD-10-CM

## 2013-10-18 LAB — CBC
HCT: 36.3 % (ref 36.0–46.0)
Hemoglobin: 12.1 g/dL (ref 12.0–15.0)
MCH: 24.3 pg — AB (ref 26.0–34.0)
MCHC: 33.3 g/dL (ref 30.0–36.0)
MCV: 72.9 fL — AB (ref 78.0–100.0)
PLATELETS: 341 10*3/uL (ref 150–400)
RBC: 4.98 MIL/uL (ref 3.87–5.11)
RDW: 16.2 % — AB (ref 11.5–15.5)
WBC: 6.8 10*3/uL (ref 4.0–10.5)

## 2013-10-18 LAB — TSH: TSH: 0.764 u[IU]/mL (ref 0.350–4.500)

## 2013-10-18 MED ORDER — HYDROCHLOROTHIAZIDE 25 MG PO TABS: 25.0000 mg | ORAL_TABLET | Freq: Every day | ORAL | Status: DC

## 2013-10-18 MED ORDER — LISINOPRIL 5 MG PO TABS: 5.0000 mg | ORAL_TABLET | Freq: Every day | ORAL | Status: DC

## 2013-10-18 NOTE — Progress Notes (Signed)
   Subjective:    Patient ID: Judith Kelly, female    DOB: 25-Oct-1970, 43 y.o.   MRN: [id number]  HPI  43 year old F who presents for evaluation of a lesion in her mouth. She also has complaints of fatigue.  Mouth lesion - Located in the upper anterior gum. This was first evaluated in January 2015, and there was concern for an abscess. At that time Pencillin was prescribed. At follow up 1 week later, the lesion appeared vesicular. Therefore, the patient was instructed to complete penicillin. The patient notes that she completed the course of penicillin. She also took well cycle here. Neither of these improved the lesion. It has been persistent since that time. It has not gotten larger. It has not causing fever or chills or drainage. It is mildly painful.  Fatigue - This is a daily problem. The patient knows that she can falsely within 10 minutes of sitting down. She thinks that this is do to her low thyroid. Interestingly enough, she feels as though her fatigue worsens when she takes her thyroid medication in the morning especially at night. She denies any difficulty sleeping at night. She denies nighttime awakenings. She sleeps 7-8 hours at night. She does snore. She is obese. She has never been evaluated for sleep apnea.  Hypertension  Office BP: BP Readings from Last 3 Encounters:  10/18/13 123/83  07/06/13 126/89  06/28/13 150/102    Prescribed meds:   Hypertension ROS:  Taking medications as prescribed:Yes Chest pain: No Shortness of breath: No Swelling of extremities: No TIA symptoms: No Regular exercise: No Low Na+ diet: No Alcohol/tobacco/drug use: No  Current Outpatient Prescriptions on File Prior to Visit  Medication Sig Dispense Refill  . acyclovir (ZOVIRAX) 200 MG capsule Take 1 capsule (200 mg total) by mouth 5 (five) times daily. Take for 7 days.  35 capsule  0  . levothyroxine (SYNTHROID, LEVOTHROID) 175 MCG tablet Take 175 mcg by mouth every evening.      .  penicillin v potassium (VEETID) 500 MG tablet Take 1 tablet (500 mg total) by mouth 4 (four) times daily. Take for 10 days  40 tablet  0   No current facility-administered medications on file prior to visit.     Review of Systems Negative for fever, chills, difficulty breathing, chest pain, swelling of her lower extremity Otherwise see HPI     Objective:   Physical Exam BP 123/83  Pulse 76  Ht 5' (1.524 m)  Wt 197 lb 12.8 oz (89.721 kg)  BMI 38.63 kg/m2  LMP 10/14/2013 Gen: obese Hispanic female, non ill appearing Mouth: small 0.5 mm vesicular lesion on the frenulum of the upper lip, no drainage or surrounding erythema, mildly tender, rest of OP clear without lesions Skin: no rashes, warm and dry Psych: does not appear fatigued or distress LE: no edema       Assessment & Plan:

## 2013-10-18 NOTE — Assessment & Plan Note (Signed)
A: lesions persistent for 5 months despite antibiotic and anti-viral; does not have an appearance of severe infection or neoplasm, but etiology unclear P: referral to dentist placed

## 2013-10-18 NOTE — Assessment & Plan Note (Signed)
A: well controlled on lisinopril, HCTZ P: cont at current dose

## 2013-10-18 NOTE — Assessment & Plan Note (Signed)
A: pt complaining of fatigue which could be related to underdosing of medication; while I think this is unlikely given her dose of  175 mcg, I need to be sure before further evaluation of fatigue P: check TSH, cont levothyroxine at same dose

## 2013-10-18 NOTE — Assessment & Plan Note (Signed)
A: excessive daytime fatigue in patient who is obese and snores is concerning for OSA, but pt resistant to sleep study P: check TSH, encourage weight loss, observe

## 2013-10-18 NOTE — Patient Instructions (Signed)
1. Lesin en la boca - voy a hacer una remisin a un dentista para su evaluacin  2. La presin arterial - Por favor, contine el lisinopril y HCTZ  3. Cansancio - Esto puede estar relacionado con la tiroides. Tambin podra ser debido a la apnea del sueo. Voy a comprobar su nivel de la tiroides y en contacto con los Whitelandresultados.  Dr. Clinton SawyerWilliamson

## 2013-10-22 LAB — IRON AND TIBC
%SAT: 8 % — ABNORMAL LOW (ref 20–55)
Iron: 37 ug/dL — ABNORMAL LOW (ref 42–145)
TIBC: 447 ug/dL (ref 250–470)
UIBC: 410 ug/dL — ABNORMAL HIGH (ref 125–400)

## 2013-10-22 LAB — FERRITIN: Ferritin: 6 ng/mL — ABNORMAL LOW (ref 10–291)

## 2013-10-23 ENCOUNTER — Telehealth: Payer: Self-pay | Admitting: Family Medicine

## 2013-10-23 NOTE — Telephone Encounter (Signed)
Pt has an appt on Friday 05/15@9 :00  Marines

## 2013-10-23 NOTE — Telephone Encounter (Signed)
Please call the patient and ask her to schedule an appointment with me to discuss her recent lab results. It is easier to do in person than on the phone. Thank you.

## 2013-10-26 ENCOUNTER — Ambulatory Visit (INDEPENDENT_AMBULATORY_CARE_PROVIDER_SITE_OTHER): Payer: No Typology Code available for payment source | Admitting: Family Medicine

## 2013-10-26 ENCOUNTER — Encounter: Payer: Self-pay | Admitting: Family Medicine

## 2013-10-26 DIAGNOSIS — D509 Iron deficiency anemia, unspecified: Secondary | ICD-10-CM

## 2013-10-26 DIAGNOSIS — E611 Iron deficiency: Secondary | ICD-10-CM

## 2013-10-26 DIAGNOSIS — E669 Obesity, unspecified: Secondary | ICD-10-CM

## 2013-10-26 MED ORDER — PHENTERMINE HCL 15 MG PO CAPS: 15.0000 mg | ORAL_CAPSULE | ORAL | Status: DC

## 2013-10-26 MED ORDER — FERROUS SULFATE 325 (65 FE) MG PO TABS: 325.0000 mg | ORAL_TABLET | Freq: Three times a day (TID) | ORAL | Status: DC

## 2013-10-26 NOTE — Progress Notes (Signed)
   Subjective:    Patient ID: Judith Kelly, female    DOB: Oct 10, 1970, 43 y.o.   MRN: [id number]  HPI  43 year old F with obesity, hypothyroidism, and fatigue who presents for followup of labs. She was last seen one week ago. Due to her fatigue, and iron panel was checked and showed low iron, low ferritin, and low iron saturation. She also has a microcytosis without anemia on her CBC. Her TSH was within normal limits.  Low Iron -  Rectal bleeding: no Abnormal vaginal bleeding: no Hx of taking iron supplements: no  Obesity -  Patient is frustrated by weight gain and would like to start taking a tablet for this. She believes that she has gained 30 lbs in the last years and thought it was due to her thyroid level. I told her that I have no evidence of low thyroid hormone levels in her at this time. She also would like to take a medication that was prescribed to her in 2012. Her records indicated that she took phentermine. She states that she took it for 8 months and lost 30-40 pounds. However, when she stopped taking it, she gained the weight back. She know that this is not a long term solution and that there are potential side effects of HTN, tachycardia, and insomnia. Regarding exercise - she states that she walks 2-3x per week for an hours. Also, she does not follow a strict diet regarding carbohydrates and sugars. She recognizes this as an area for improvement.   Interview performed in Spanish   Review of Systems     Objective:   Physical Exam BP 129/86  Pulse 65  Temp(Src) 97.8 F (36.6 C) (Oral)  Ht 5' (1.524 m)  Wt 201 lb 4.8 oz (91.309 kg)  BMI 39.31 kg/m2  LMP 10/14/2013 Gen: obese Hispanic female, non ill appearing  Eyes: no conjunctival pallor CV: RRR, no murmurs Pulm: CTA-B LE: no edema  Wt Readings from Last 5 Encounters:  10/26/13 201 lb 4.8 oz (91.309 kg)  10/18/13 197 lb 12.8 oz (89.721 kg)  07/06/13 190 lb 14.4 oz (86.592 kg)  06/28/13 193 lb (87.544 kg)    03/26/13 188 lb (85.276 kg)          Assessment & Plan:

## 2013-10-26 NOTE — Patient Instructions (Signed)
Judith Kelly,   1. [facility]ic[facility] 3 tabletas de hierro cada da con las comidas. Se absorbe mejor si se bebe con el jugo de naranja.  2. La obesidad - Necesitas hacer ms ejercicio. Es necesario disminuir la cantidad de pan, arroz, tortillas y alimentos con Product/process development scientistmucha azcar. Tambin puede utilizar la fentermina. Por favor, sepa que esto puede aumentar la frecuencia cardaca y la presin arterial.  Regresa en 1 mes para la proxima cita.  Dr. Clinton SawyerWilliamson

## 2013-10-26 NOTE — Assessment & Plan Note (Signed)
A: pt interested in weight loss supplement, also realizes that she needs to increase exercise and improve diet  P:  - Restart phentermine since pt tolerated it well in 2012 - Explained risks and pt acknowledged this - F/u in 1 month or sooner if needed

## 2013-10-26 NOTE — Assessment & Plan Note (Signed)
A: low Fe and ferritin without anemia, although microcytosis present; no signs of abnormal bleeding P: start Fe supplementation TID with meals x 1 month

## 2013-11-01 ENCOUNTER — Ambulatory Visit: Payer: No Typology Code available for payment source

## 2013-11-01 ENCOUNTER — Other Ambulatory Visit: Payer: Self-pay | Admitting: *Deleted

## 2013-11-02 ENCOUNTER — Other Ambulatory Visit: Payer: Self-pay | Admitting: *Deleted

## 2013-11-02 MED ORDER — LEVOTHYROXINE SODIUM 175 MCG PO TABS: 175.0000 ug | ORAL_TABLET | Freq: Every evening | ORAL | Status: DC

## 2013-12-05 ENCOUNTER — Encounter: Payer: Self-pay | Admitting: Family Medicine

## 2013-12-05 ENCOUNTER — Ambulatory Visit (INDEPENDENT_AMBULATORY_CARE_PROVIDER_SITE_OTHER): Payer: No Typology Code available for payment source | Admitting: Family Medicine

## 2013-12-05 VITALS — BP 123/79 | HR 94 | Temp 98.1°F | Ht 60.0 in | Wt 194.2 lb

## 2013-12-05 DIAGNOSIS — I10 Essential (primary) hypertension: Secondary | ICD-10-CM

## 2013-12-05 DIAGNOSIS — IMO0002 Reserved for concepts with insufficient information to code with codable children: Secondary | ICD-10-CM

## 2013-12-05 DIAGNOSIS — Q054 Unspecified spina bifida with hydrocephalus: Secondary | ICD-10-CM

## 2013-12-05 DIAGNOSIS — G935 Compression of brain: Secondary | ICD-10-CM

## 2013-12-05 DIAGNOSIS — R29898 Other symptoms and signs involving the musculoskeletal system: Secondary | ICD-10-CM

## 2013-12-05 DIAGNOSIS — E669 Obesity, unspecified: Secondary | ICD-10-CM

## 2013-12-05 LAB — BASIC METABOLIC PANEL
BUN: 15 mg/dL (ref 6–23)
CALCIUM: 9.5 mg/dL (ref 8.4–10.5)
CO2: 26 mEq/L (ref 19–32)
Chloride: 99 mEq/L (ref 96–112)
Creat: 0.81 mg/dL (ref 0.50–1.10)
Glucose, Bld: 119 mg/dL — ABNORMAL HIGH (ref 70–99)
POTASSIUM: 4.4 meq/L (ref 3.5–5.3)
SODIUM: 135 meq/L (ref 135–145)

## 2013-12-05 MED ORDER — PHENTERMINE HCL 15 MG PO CAPS: 15.0000 mg | ORAL_CAPSULE | ORAL | Status: DC

## 2013-12-05 NOTE — Patient Instructions (Signed)
Malformacin de Chiari - Esto podra ser la causa de la debilidad de ustedes brazos. Por lo tanto, tenemos que conseguir una Health visitorresonancia magntica.  Prdida de peso - Continuar las Astronomerpastillas cada da. Usted tambin debe hacer ejercicio o usted ganar el peso detrs. Por favor, hganos saber si usted desarrolla sntomas como palpitaciones del corazn o la ansiedad. No es Pharmacologistseguro estar en este medicamento a Air cabin crewlargo plazo. As que te recomiendo parar esto en 6-12 meses.  Fue un placer estar a su mdico.  Atentamente,  Dr. Clinton SawyerWilliamson

## 2013-12-05 NOTE — Progress Notes (Signed)
   Subjective:    Patient ID: Judith Kelly, female    DOB: [birth year], 43 y.o.   MRN: [id number]  HPI  Weight Check - Pt taking phentermine for approximately 6 weeks, she notes decreased appetite and decreasing weight, she is not exercising; she denies palpitations, chest pain, anxiety  Chiari Malformation - diagnosed in 2007, stage 1, 7 mm herniation, no recent follow up since MRI in 2007; pt is having tingling and heaviness of her arms, it is intermittent; her migraine headaches have improved since being on blood pressure medication   PMH - obesity, chiari malformation   Review of Systems See hpi     Objective:   Physical Exam BP 123/79  Pulse 94  Temp(Src) 98.1 F (36.7 C) (Oral)  Ht 5' (1.524 m)  Wt 194 lb 3.2 oz (88.089 kg)  BMI 37.93 kg/m2  LMP 11/16/2013  Gen: obese, Hispanic female, non ill appearing, pleasant CV: RRR, no murmurs Pulm: CTA-B Upper extremities: normal senation, 5/5 strength today  Wt Readings from Last 5 Encounters:  12/05/13 194 lb 3.2 oz (88.089 kg)  10/26/13 201 lb 4.8 oz (91.309 kg)  10/18/13 197 lb 12.8 oz (89.721 kg)  07/06/13 190 lb 14.4 oz (86.592 kg)  06/28/13 193 lb (87.544 kg)         Assessment & Plan:

## 2013-12-05 NOTE — Assessment & Plan Note (Signed)
A: heaviness of her arms is possible symptoms P: obtain MRI today, if worsening, then refer to neurosurgery

## 2013-12-05 NOTE — Assessment & Plan Note (Signed)
A: improved weight, no side effects, unfortunately patient has not change lifestyle to promote long term weight loss or maintenance P:  - Cont phentermine for 3 more months, stop after than to see how pt weight changes

## 2013-12-13 ENCOUNTER — Ambulatory Visit (HOSPITAL_COMMUNITY)
Admission: RE | Admit: 2013-12-13 | Discharge: 2013-12-13 | Disposition: A | Discharge status: Home / Self Care | Payer: No Typology Code available for payment source | Source: Ambulatory Visit | Attending: Family Medicine | Admitting: Family Medicine

## 2013-12-13 DIAGNOSIS — G935 Compression of brain: Secondary | ICD-10-CM | POA: Insufficient documentation

## 2013-12-13 DIAGNOSIS — R29898 Other symptoms and signs involving the musculoskeletal system: Secondary | ICD-10-CM | POA: Insufficient documentation

## 2013-12-13 MED ORDER — GADOBENATE DIMEGLUMINE 529 MG/ML IV SOLN
18.0000 mL | Freq: Once | INTRAVENOUS | Status: AC | PRN
  Administered 2013-12-13: 18 mL via INTRAVENOUS

## 2014-03-14 ENCOUNTER — Ambulatory Visit: Payer: No Typology Code available for payment source | Admitting: Family Medicine

## 2014-03-14 ENCOUNTER — Ambulatory Visit (INDEPENDENT_AMBULATORY_CARE_PROVIDER_SITE_OTHER): Payer: No Typology Code available for payment source | Admitting: Family Medicine

## 2014-03-14 VITALS — BP 117/82 | HR 81 | Temp 98.2°F | Ht 60.0 in | Wt 192.8 lb

## 2014-03-14 DIAGNOSIS — N760 Acute vaginitis: Secondary | ICD-10-CM

## 2014-03-14 MED ORDER — FLUCONAZOLE 100 MG PO TABS: ORAL_TABLET | ORAL | Status: DC

## 2014-03-14 MED ORDER — TERCONAZOLE 0.4 % VA CREA: 1.0000 | TOPICAL_CREAM | Freq: Every day | VAGINAL | Status: DC

## 2014-03-18 NOTE — Progress Notes (Signed)
   Subjective:    Patient ID: Judith Kelly, female    DOB: [birth year], 43 y.o.   MRN: [id number]  HPI Itching in the external genital region for the last week or so. Mild vaginal is doing with some thick white discharge. No new sexual partners. No abdominal pain, no dysuria.   Review of Systems No fever. No abnormal vaginal bleeding.    Objective:   Physical Exam  Vital signs reviewed GENERAL: Well-developed female no acute distress and GU: Externally some erythema with a few satellite lesions. Vaginal vault is normal with some thick white vaginal discharge.      Assessment & Plan:  Yeast vaginitis. Her surrogate her blood sugar checked as she's been borderline high in the past and certainly new yeast infections could be an indicator that her blood sugar is elevated. I gave her combination of Diflucan pill and vaginal cream. Followup if not improving

## 2014-11-12 ENCOUNTER — Other Ambulatory Visit: Payer: Self-pay | Admitting: *Deleted

## 2014-11-12 DIAGNOSIS — E031 Congenital hypothyroidism without goiter: Secondary | ICD-10-CM

## 2014-11-13 MED ORDER — LEVOTHYROXINE SODIUM 175 MCG PO TABS: 175.0000 ug | ORAL_TABLET | Freq: Every evening | ORAL | Status: DC

## 2014-11-21 ENCOUNTER — Ambulatory Visit: Payer: Self-pay

## 2014-12-17 ENCOUNTER — Other Ambulatory Visit: Payer: Self-pay | Admitting: *Deleted

## 2014-12-17 DIAGNOSIS — I10 Essential (primary) hypertension: Secondary | ICD-10-CM

## 2014-12-17 MED ORDER — LISINOPRIL 5 MG PO TABS: 5.0000 mg | ORAL_TABLET | Freq: Every day | ORAL | Status: DC

## 2015-05-20 ENCOUNTER — Ambulatory Visit (INDEPENDENT_AMBULATORY_CARE_PROVIDER_SITE_OTHER): Payer: Self-pay | Admitting: Family Medicine

## 2015-05-20 ENCOUNTER — Encounter: Payer: Self-pay | Admitting: Family Medicine

## 2015-05-20 VITALS — BP 115/69 | HR 93 | Temp 97.5°F | Ht 60.0 in | Wt 191.0 lb

## 2015-05-20 DIAGNOSIS — E031 Congenital hypothyroidism without goiter: Secondary | ICD-10-CM

## 2015-05-20 DIAGNOSIS — B009 Herpesviral infection, unspecified: Secondary | ICD-10-CM

## 2015-05-20 DIAGNOSIS — E018 Other iodine-deficiency related thyroid disorders and allied conditions: Secondary | ICD-10-CM

## 2015-05-20 LAB — T3, FREE: T3 FREE: 3.6 pg/mL (ref 2.3–4.2)

## 2015-05-20 LAB — TSH: TSH: 0.092 u[IU]/mL — ABNORMAL LOW (ref 0.350–4.500)

## 2015-05-20 LAB — T4, FREE: FREE T4: 2.1 ng/dL — AB (ref 0.80–1.80)

## 2015-05-20 MED ORDER — ACYCLOVIR 200 MG PO CAPS: 200.0000 mg | ORAL_CAPSULE | Freq: Every day | ORAL | Status: DC

## 2015-05-20 NOTE — Patient Instructions (Signed)
Voy a Actuaryllamar en dos dias cuando tengo los [place]resultados de las pruebas y podemos decidir sobre su dosis de synthroid.

## 2015-05-23 MED ORDER — LEVOTHYROXINE SODIUM 150 MCG PO TABS
150.0000 ug | ORAL_TABLET | Freq: Every evening | ORAL | Status: DC
Start: 2015-05-23 — End: 2015-11-06

## 2015-05-23 NOTE — Progress Notes (Signed)
   Subjective:   Deon PillingOralia Guajardo is a 44 y.o. female with a history of hypothyroidism, htn here for f/u thyroid  Pt reports that she recently saw a weight loss doctor who tested her thyroid and said it was high. She has not changed her dose of synthroid and has taken it regularly. She has no new symptoms like skin or hear changes, heat/cold intolerance, stool changes or palpitations.   Review of Systems:  Per HPI. All other systems reviewed and are negative.   PMH, PSH, Medications, Allergies, and FmHx reviewed and updated in EMR.  Social History: never smoker  Objective:  BP 115/69 mmHg  Pulse 93  Temp(Src) 97.5 F (36.4 C) (Oral)  Ht 5' (1.524 m)  Wt 191 lb (86.637 kg)  BMI 37.30 kg/m2  LMP 04/29/2015  Gen:  44 y.o. female in NAD HEENT: NCAT, MMM, EOMI, PERRL, anicteric sclerae, neck supple with thyromegaly or masses CV: RRR, no MRG, no JVD Resp: Non-labored, CTAB, no wheezes noted Abd: Soft, NTND, BS present, no guarding or organomegaly Ext: WWP, no edema MSK: Full ROM, strength intact Neuro: Alert and oriented, speech normal      Chemistry      Component Value Date/Time   NA 135 12/05/2013 0950   K 4.4 12/05/2013 0950   CL 99 12/05/2013 0950   CO2 26 12/05/2013 0950   BUN 15 12/05/2013 0950   CREATININE 0.81 12/05/2013 0950   CREATININE 0.85 12/01/2012 1731      Component Value Date/Time   CALCIUM 9.5 12/05/2013 0950   ALKPHOS 96 12/01/2012 1731   AST 23 12/01/2012 1731   ALT 27 12/01/2012 1731   BILITOT 0.2* 12/01/2012 1731      Lab Results  Component Value Date   WBC 6.8 10/18/2013   HGB 12.1 10/18/2013   HCT 36.3 10/18/2013   MCV 72.9* 10/18/2013   PLT 341 10/18/2013   Lab Results  Component Value Date   TSH 0.092* 05/20/2015   Lab Results  Component Value Date   HGBA1C 5.9 04/05/2011   Assessment & Plan:     Deon PillingOralia Guajardo is a 44 y.o. female here for thyroid f/u  HYPOTHYROIDISM, POST-RADIOACTIVE IODINE Low TSH at weight loss clinic,  patient concerned about cancer, no new symptoms, no recent changes in synthroid dose - recheck TSH, T3, T4 - adjust synthroid dose as needed - [place]    Beverely LowElena Noriko Macari, MD, MPH [facility] PGY-3 05/23/2015 10:44 AM

## 2015-05-23 NOTE — Assessment & Plan Note (Signed)
Low TSH at weight loss clinic, patient concerned about cancer, no new symptoms, no recent changes in synthroid dose - recheck TSH, T3, T4 - adjust synthroid dose as needed - [place]heck [place]

## 2015-07-04 ENCOUNTER — Ambulatory Visit: Payer: Self-pay

## 2015-07-08 ENCOUNTER — Encounter: Payer: Self-pay | Admitting: Family Medicine

## 2015-07-08 ENCOUNTER — Ambulatory Visit (INDEPENDENT_AMBULATORY_CARE_PROVIDER_SITE_OTHER): Payer: Self-pay | Admitting: Family Medicine

## 2015-07-08 VITALS — BP 133/79 | HR 72 | Temp 97.8°F | Ht 60.0 in | Wt 203.1 lb

## 2015-07-08 DIAGNOSIS — E611 Iron deficiency: Secondary | ICD-10-CM

## 2015-07-08 DIAGNOSIS — E018 Other iodine-deficiency related thyroid disorders and allied conditions: Secondary | ICD-10-CM

## 2015-07-08 LAB — CBC
HEMATOCRIT: 38.5 % (ref 36.0–46.0)
HEMOGLOBIN: 12.4 g/dL (ref 12.0–15.0)
MCH: 24.9 pg — ABNORMAL LOW (ref 26.0–34.0)
MCHC: 32.2 g/dL (ref 30.0–36.0)
MCV: 77.5 fL — ABNORMAL LOW (ref 78.0–100.0)
MPV: 10.4 fL (ref 8.6–12.4)
Platelets: 377 10*3/uL (ref 150–400)
RBC: 4.97 MIL/uL (ref 3.87–5.11)
RDW: 16.1 % — AB (ref 11.5–15.5)
WBC: 8.2 10*3/uL (ref 4.0–10.5)

## 2015-07-08 LAB — FERRITIN: FERRITIN: 10 ng/mL (ref 10–291)

## 2015-07-08 LAB — TSH: TSH: 2.771 u[IU]/mL (ref 0.350–4.500)

## 2015-07-10 NOTE — Assessment & Plan Note (Signed)
History of iron def anemia, not on any supplements currently, not checked in several years - check CBC and ferritin

## 2015-07-10 NOTE — Progress Notes (Signed)
   Subjective:   Judith Kelly is a 45 y.o. female with a history of hypothyroidism, htn here for f/u thyroid  Pt's synthroid dose decreased 6 weeks ago. Reports good compliance since then. Taking now. She has no new symptoms like skin or hear changes, heat/cold intolerance, stool changes or palpitations.   Review of Systems:  Per HPI. All other systems reviewed and are negative.   PMH, PSH, Medications, Allergies, and FmHx reviewed and updated in EMR.  Social History: never smoker  Objective:  BP 133/79 mmHg  Pulse 72  Temp(Src) 97.8 F (36.6 C) (Oral)  Ht 5' (1.524 m)  Wt 203 lb 1.6 oz (92.126 kg)  BMI 39.67 kg/m2  Gen:  45 y.o. female in NAD HEENT: NCAT, MMM, EOMI, PERRL, anicteric sclerae, neck supple with thyromegaly or masses CV: RRR, no MRG, no JVD Resp: Non-labored, CTAB, no wheezes noted Abd: Soft, NTND, BS present, no guarding or organomegaly Ext: WWP, no edema MSK: Full ROM, strength intact Neuro: Alert and oriented, speech normal      Chemistry      Component Value Date/Time   NA 135 12/05/2013 0950   K 4.4 12/05/2013 0950   CL 99 12/05/2013 0950   CO2 26 12/05/2013 0950   BUN 15 12/05/2013 0950   CREATININE 0.81 12/05/2013 0950   CREATININE 0.85 12/01/2012 1731      Component Value Date/Time   CALCIUM 9.5 12/05/2013 0950   ALKPHOS 96 12/01/2012 1731   AST 23 12/01/2012 1731   ALT 27 12/01/2012 1731   BILITOT 0.2* 12/01/2012 1731      Lab Results  Component Value Date   WBC 8.2 07/08/2015   HGB 12.4 07/08/2015   HCT 38.5 07/08/2015   MCV 77.5* 07/08/2015   PLT 377 07/08/2015   Lab Results  Component Value Date   TSH 2.771 07/08/2015   Lab Results  Component Value Date   HGBA1C 5.9 04/05/2011   Assessment & Plan:     Judith Kelly is a 45 y.o. female here for thyroid f/u  HYPOTHYROIDISM, POST-RADIOACTIVE IODINE Stable, asymtomatic, on new synthroid dose ( ) for 6 weeks - recheck TSH, adjust as needed - f/u in 3  months or sooner if dose change needed  Iron deficiency History of iron def anemia, not on any supplements currently, not checked in several years - check CBC and ferritin     Beverely Low, MD, MPH [facility] PGY-3 07/10/2015 8:04 PM

## 2015-07-10 NOTE — Assessment & Plan Note (Signed)
Stable, asymtomatic, on new synthroid dose ( ) for 6 weeks - recheck TSH, adjust as needed - f/u in 3 months or sooner if dose change needed

## 2015-07-11 ENCOUNTER — Telehealth: Payer: Self-pay | Admitting: Family Medicine

## 2015-07-11 LAB — POCT GLUCOSE (2 HR PP): GLUCOSE 2 HR PP, POC: 82 mg/dL

## 2015-07-11 NOTE — Telephone Encounter (Signed)
Patient asks PCP about Lab's results (07-08-15).  Please, follow up with Patient (Spanish).

## 2015-07-11 NOTE — Telephone Encounter (Signed)
Please tell her that her thyroid and iron levels were both normal. Thanks!

## 2015-07-11 NOTE — Telephone Encounter (Signed)
Will forward to MD. Gavriel Holzhauer,CMA  

## 2015-08-19 ENCOUNTER — Encounter: Payer: Self-pay | Admitting: Family Medicine

## 2015-08-19 ENCOUNTER — Ambulatory Visit (INDEPENDENT_AMBULATORY_CARE_PROVIDER_SITE_OTHER): Payer: No Typology Code available for payment source | Admitting: Family Medicine

## 2015-08-19 VITALS — BP 113/65 | HR 72 | Temp 97.6°F | Resp 14 | Ht 59.0 in | Wt 204.0 lb

## 2015-08-19 DIAGNOSIS — I10 Essential (primary) hypertension: Secondary | ICD-10-CM

## 2015-08-19 DIAGNOSIS — E611 Iron deficiency: Secondary | ICD-10-CM

## 2015-08-19 DIAGNOSIS — E018 Other iodine-deficiency related thyroid disorders and allied conditions: Secondary | ICD-10-CM

## 2015-08-19 DIAGNOSIS — Z789 Other specified health status: Secondary | ICD-10-CM

## 2015-08-19 LAB — CBC WITH DIFFERENTIAL/PLATELET
Basophils Absolute: 0 10*3/uL (ref 0.0–0.1)
Basophils Relative: 0 % (ref 0–1)
EOS PCT: 2 % (ref 0–5)
Eosinophils Absolute: 0.2 10*3/uL (ref 0.0–0.7)
HEMATOCRIT: 37.8 % (ref 36.0–46.0)
Hemoglobin: 12.7 g/dL (ref 12.0–15.0)
LYMPHS PCT: 36 % (ref 12–46)
Lymphs Abs: 3 10*3/uL (ref 0.7–4.0)
MCH: 26.1 pg (ref 26.0–34.0)
MCHC: 33.6 g/dL (ref 30.0–36.0)
MCV: 77.6 fL — AB (ref 78.0–100.0)
MONO ABS: 0.4 10*3/uL (ref 0.1–1.0)
MONOS PCT: 5 % (ref 3–12)
MPV: 9.5 fL (ref 8.6–12.4)
Neutro Abs: 4.8 10*3/uL (ref 1.7–7.7)
Neutrophils Relative %: 57 % (ref 43–77)
Platelets: 316 10*3/uL (ref 150–400)
RBC: 4.87 MIL/uL (ref 3.87–5.11)
RDW: 15.7 % — AB (ref 11.5–15.5)
WBC: 8.4 10*3/uL (ref 4.0–10.5)

## 2015-08-19 LAB — POCT URINALYSIS DIP (DEVICE)
Bilirubin Urine: NEGATIVE
Glucose, UA: NEGATIVE mg/dL
KETONES UR: NEGATIVE mg/dL
Leukocytes, UA: NEGATIVE
Nitrite: NEGATIVE
PH: 6.5 (ref 5.0–8.0)
PROTEIN: NEGATIVE mg/dL
Specific Gravity, Urine: 1.025 (ref 1.005–1.030)
Urobilinogen, UA: 0.2 mg/dL (ref 0.0–1.0)

## 2015-08-19 LAB — TSH: TSH: 2.09 m[IU]/L

## 2015-08-19 NOTE — Progress Notes (Signed)
Subjective:    Patient ID: Judith Kelly, female    DOB: 1970-11-05, 45 y.o.   MRN: [id number]  HPI [name], a 45 year old female with a history of hypothyroidism and morbid obesity presents to establish care. Patient states that she was a patient of Dr. Beverely Low at [facility]. Patient here for follow-up of elevated blood pressure. She is not exercising and is not adherent to a low fat diet at home.  She is not checking blood pressures at home. Patient denies chest pain, claudication, dyspnea, fatigue, irregular heart beat, orthopnea, palpitations and tachypnea.  Cardiovascular risk factors include: hypertension, obesity (BMI >= 30 kg/m2) and sedentary lifestyle.   Patient also has a history of hypothyroidism. Patient is complaining of increased weight gain over the past several months.  She denies fatigue, headache, heart palpitations, constipation, hair/skin changes. Previous thyroid studies include TSH, T3 and T4.  Past Medical History  Diagnosis Date  . ALLERGIC RHINITIS 10/04/2008  . FIBRILLATION, ATRIAL 04/05/2007    Annotation: 06/25/04, transient atrial fibrillation secondary to  hyperthyroidism; resolved  Qualifier: History of  By: Sharen Hones  MD, Wynona Canes    . Rosacea 10/15/2006    Qualifier: Diagnosis of  By: Barbaraann Barthel MD, [name]    No past surgical history on file.  Immunization History  Administered Date(s) Administered  . Influenza Split 04/05/2011, 07/11/2015  . Influenza Whole 06/30/2009, 05/04/2010  . Td 07/31/2004   Social History   Social History  . Marital Status: Married    Spouse Name: N/A  . Number of Children: N/A  . Years of Education: N/A   Occupational History  . Not on file.   Social History Main Topics  . Smoking status: Never Smoker   . Smokeless tobacco: Never Used  . Alcohol Use: No  . Drug Use: Not on file  . Sexual Activity: Not on file   Other Topics Concern  . Not on file   Social History Narrative    Review of Systems   Constitutional: Positive for unexpected weight change (weight gain). Negative for fever and fatigue.  HENT: Negative.   Eyes: Negative.  Negative for photophobia and visual disturbance.  Respiratory: Negative.  Negative for shortness of breath and stridor.   Cardiovascular: Negative.   Gastrointestinal: Negative.  Negative for nausea, diarrhea and constipation.  Endocrine: Negative for cold intolerance, heat intolerance, polydipsia, polyphagia and polyuria.  Genitourinary: Negative.  Negative for hematuria, menstrual problem and pelvic pain.  Musculoskeletal: Negative.   Skin: Negative.   Allergic/Immunologic: Negative.  Negative for immunocompromised state.  Neurological: Negative.  Negative for dizziness and facial asymmetry.  Hematological: Negative.   Psychiatric/Behavioral: Negative.        Objective:   Physical Exam  Constitutional: She is oriented to person, place, and time. She appears well-developed.  Morbid obesity  HENT:  Head: Normocephalic and atraumatic.  Right Ear: External ear normal.  Left Ear: External ear normal.  Mouth/Throat: Oropharynx is clear and moist.  Eyes: Conjunctivae and EOM are normal. Pupils are equal, round, and reactive to light.  Neck: Normal range of motion. Neck supple.  Cardiovascular: Normal rate, regular rhythm, normal heart sounds and intact distal pulses.   Pulmonary/Chest: Effort normal and breath sounds normal.  Abdominal: Soft. Bowel sounds are normal.  Increased abdominal girth  Musculoskeletal: Normal range of motion.  Neurological: She is alert and oriented to person, place, and time. She has normal reflexes.  Skin: Skin is warm and dry.  Psychiatric: She has a  normal mood and affect. Her behavior is normal. Judgment and thought content normal.       BP 113/65 mmHg  Pulse 72  Temp(Src) 97.6 F (36.4 C) (Oral)  Resp 14  Ht 4\' 11"  (1.499 m)  Wt 204 lb (92.534 kg)  BMI 41.18 kg/m2  LMP 07/07/2015 Assessment & Plan:  1.  Essential hypertension, benign Blood pressure is at goal on current medication regimen. The patient is asked to make an attempt to improve diet and exercise patterns to aid in medical management of this problem. - POCT urinalysis dipstick - COMPLETE METABOLIC PANEL WITH GFR  2. HYPOTHYROIDISM, POST-RADIOACTIVE IODINE Continue Levothyroxine at current dosage. Will review laboratory results as they become available.  - TSH  3. Iron deficiency - CBC with Differential  4. Language barrier to communication Utilized video interpreter to assist with communication  5. Morbid obesity, unspecified obesity type (HCC) Recommend a lowfat, low carbohydrate diet divided over 5-6 small meals, increase water intake to 6-8 glasses, and 150 minutes per week of cardiovascular exercise.   - Hemoglobin A1c - COMPLETE METABOLIC PANEL WITH GFR    RTC: Will follow up by phone with laboratory results. Will send medication to pharmacy after reviewing labs. Return to clinic for follow up in 3 months.    Baya Lentz M, FNP   The patient was given clear instructions to go to ER or return to medical center if symptoms do not improve, worsen or new problems develop. The patient verbalized understanding. Will notify patient with laboratory results.

## 2015-08-19 NOTE — Patient Instructions (Signed)
Plan de alimentacin DASH (DASH Eating Plan) DASH es la sigla en ingls de "Enfoques Alimentarios para Detener la Hipertensin". El plan de alimentacin DASH ha demostrado bajar la presin arterial elevada (hipertensin). Los beneficios adicionales para la salud pueden incluir la disminucin del riesgo de diabetes mellitus tipo2, enfermedades cardacas e ictus. Este plan tambin puede ayudar a Horticulturist, commercial. QU DEBO SABER ACERCA DEL PLAN DE ALIMENTACIN DASH? Para el plan de alimentacin DASH, seguir las siguientes pautas generales:  Elija los alimentos con un valor porcentual diario de sodio de menos del 5% (segn figura en la etiqueta del alimento).  Use hierbas o aderezos sin sal, en lugar de sal de mesa o sal marina.  Consulte al mdico o farmacutico antes de usar sustitutos de la sal.  Coma productos con bajo contenido de sodio, cuya etiqueta suele decir "bajo contenido de sodio" o "sin agregado de sal".  Coma alimentos frescos.  Coma ms verduras, frutas y productos lcteos con bajo contenido de [place].  Elija los cereales integrales. Busque la palabra "integral" en Equities trader de la lista de ingredientes.  Elija el pescado y el pollo o el pavo sin piel ms a menudo que las carnes rojas. Limite el consumo de pescado, carne de ave y carne a 6onzas (170g) por Training and development officer.  Limite el consumo de dulces, postres, azcares y bebidas azucaradas.  Elija las grasas saludables para el corazn.  Limite el consumo de queso a 1onza (28g) por Training and development officer.  Consuma ms comida casera y menos de restaurante, de buf y comida rpida.  Limite el consumo de alimentos fritos.  Cocine los alimentos utilizando mtodos que no sean la fritura.  Limite las verduras enlatadas. Si las consume, enjuguelas bien para disminuir el sodio.  Cuando coma en un restaurante, pida que preparen su comida con menos sal o, en lo posible, sin nada de sal. QU ALIMENTOS PUEDO COMER? Pida ayuda a un nutricionista para  conocer las necesidades calricas individuales. Cereales Pan de salvado o integral. Arroz integral. Pastas de salvado o integrales. Quinua, trigo burgol y cereales integrales. Cereales con bajo contenido de sodio. Tortillas de harina de maz o de salvado. Pan de maz integral. Galletas saladas integrales. Galletas con bajo contenido de [place]. Vegetales Verduras frescas o congeladas (crudas, al vapor, asadas o grilladas). Jugos de tomate y verduras con contenido bajo o reducido de sodio. Pasta y salsa de tomate con contenido bajo o [place]. Verduras enlatadas con bajo contenido de sodio o reducido de sodio.  Lambert Mody Lambert Mody frescas, en conserva (en su jugo natural) o frutas congeladas. Carnes y otros productos con protenas Carne de res molida (al 85% o ms [place]), carne de res de animales alimentados con pastos o carne de res sin la grasa. Pollo o pavo sin piel. Carne de pollo o de [place]. Cerdo sin la grasa. Todos los pescados y frutos de mar. Huevos. Porotos, guisantes o lentejas secos. Frutos secos y semillas sin sal. Frijoles enlatados sin sal. Lcteos Productos lcteos con bajo contenido de grasas, como [place] o al 1%, quesos reducidos en grasas o al 2%, ricota con bajo contenido de grasas o [employer], o yogur natural con bajo contenido de [place]. Quesos con contenido bajo o reducido de sodio. Grasas y Naval architect en barra que no contengan grasas trans. Mayonesa y alios para ensaladas livianos o reducidos en grasas (reducidos en sodio). [place]. Aceites de crtamo, oliva o canola. Mantequilla natural de man o almendra. Otros Palomitas de maz y pretzels sin sal.  Los artculos mencionados arriba pueden no ser Raytheonuna lista completa de las bebidas o los alimentos recomendados. Comunquese con el nutricionista para conocer ms opciones. QU ALIMENTOS NO SE RECOMIENDAN? Cereales Pan blanco. Pastas blancas. Arroz blanco. Pan de maz refinado. Bagels y  croissants. Galletas saladas que contengan grasas trans. Vegetales Vegetales con crema o fritos. Verduras en salsa de Boothwynqueso. Verduras enlatadas comunes. Pasta y salsa de tomate en lata comunes. Jugos comunes de tomate y de verduras. Nils PyleFrutas Frutas secas. Fruta enlatada en almbar liviano o espeso. Jugo de frutas. Carnes y otros productos con protenas Cortes de carne con Holiday representativegrasa. Costillas, alas de pollo, tocineta, salchicha, mortadela, salame, chinchulines, tocino, perros calientes, salchichas alemanas y embutidos envasados. Frutos secos y semillas con sal. Frijoles con sal en lata. Lcteos Leche entera o al 2%, crema, mezcla de Blackeyleche y crema, y queso crema. Yogur entero o endulzado. Quesos o queso azul con alto contenido de Neurosurgeongrasas. Cremas no lcteas y coberturas batidas. Quesos procesados, quesos para untar o cuajadas. Condimentos Sal de cebolla y ajo, sal condimentada, sal de mesa y sal marina. Salsas en lata y envasadas. Salsa [place]. Salsa trtara. Salsa barbacoa. Salsa teriyaki. Salsa de soja, incluso la que tiene contenido reducido de [place] Puyallupsodio. Salsa de carne. Salsa de pescado. Salsa de Bremertonostras. Salsa rosada. Rbano picante. Ketchup y mostaza. Saborizantes y tiernizantes para carne. Caldo en cubitos. Salsa picante. Salsa tabasco. Adobos. Aderezos para tacos. Salsas. Grasas y [address] Mantequilla, Indiamargarina en barra, Orosimanteca de Blue Hillscerdo, Perthgrasa, Singaporemantequilla clarificada y Steffanie Rainwatergrasa de tocino. Aceites de coco, de palmiste o de palma. Aderezos comunes para ensalada. Otros Pickles y Muttontownaceitunas. Palomitas de maz y pretzels con sal. Los artculos mencionados arriba pueden no ser Raytheonuna lista completa de las bebidas y los alimentos que se Theatre stage managerdeben evitar. Comunquese con el nutricionista para obtener ms informacin. DNDE Raelyn MoraPUEDO ENCONTRAR MS INFORMACIN? Instituto Nacional del Hillsboroorazn, del Pulmn y de Risk managerla Sangre (National Heart, Lung, and Blood Institute): CablePromo.itwww.nhlbi.nih.gov/health/health-topics/topics/dash/     Esta informacin no tiene Theme park managercomo fin reemplazar el consejo del mdico. Asegrese de hacerle al mdico cualquier pregunta que tenga.   Document Released: 05/20/2011 Document Revised: 06/21/2014 Elsevier Interactive Patient Education 2016 [employer] Inc. Hipotiroidismo (Hypothyroidism) El hipotiroidismo es un trastorno de la tiroides, una glndula grande ubicada en la parte anterior e inferior del cuello. La tiroides Nature conservation officerlibera hormonas que controlan el funcionamiento del Vassar Collegeorganismo. En los casos de hipotiroidismo, la glndula no produce la cantidad suficiente de estas hormonas. CAUSAS Las causas del hipotiroidismo pueden incluir lo siguiente:  Infecciones virales.  Embarazo.  Un ataque del sistema de defensa (sistema inmunitario) a la tiroides.  Algunos medicamentos.  Defectos de nacimiento.  Radioterapias anteriores en la cabeza o el cuello.  Tratamiento previo con yodo radioactivo.  Extirpacin quirrgica previa de una parte o de toda la tiroides.  Problemas con la glndula ubicada en el centro del cerebro (hipfisis). SIGNOS Y SNTOMAS Los signos y los sntomas de hipotiroidismo pueden ser los siguientes:  Sensacin de falta de Engineer, drillingenerga (Candelero Arribaletargo).  Incapacidad para tolerar el fro.  Aumento de peso que no puede explicarse por un cambio en la dieta o en los hbitos de ejercicio fsico.  Piel seca.  Pelo grueso.  Irregularidades menstruales.  Ralentizacin de los procesos de pensamiento.  Estreimiento.  Tristeza o depresin. DIAGNSTICO  El mdico puede diagnosticar el hipotiroidismo con anlisis de sangre y ecografas. TRATAMIENTO El hipotiroidismo se trata con medicamentos que reemplazan las hormonas que el cuerpo no produce. Despus de Microbiologistcomenzar el tratamiento, pueden pasar varias  semanas hasta la desaparicin de los sntomas. INSTRUCCIONES PARA EL CUIDADO EN EL HOGAR   Tome los medicamentos solamente como se lo haya indicado el mdico.  Si empieza a tomar  medicamentos nuevos, infrmele al mdico.  Concurra a todas las visitas de control como se lo haya indicado el mdico. Esto es importante. A medida que la enfermedad mejora, es posible que haya que modificar las dosis. Tendr que hacerse anlisis de sangre peridicamente, de modo que el mdico pueda controlar la enfermedad. SOLICITE ATENCIN MDICA SI:  Los sntomas no mejoran con Scientist, research (medical).  Est tomando medicamentos sustitutivos de la tiroides y:  Artist.  Siente temblores.  Est ansioso.  Baja de peso rpidamente.  No puede Patent examiner.  Tiene cambios emocionales.  Tiene diarrea.  Se siente dbil. SOLICITE ATENCIN MDICA DE INMEDIATO SI:   Siente dolor en el pecho.  Tiene latidos cardacos irregulares o siente dolor en el pecho.  Nota que la frecuencia cardaca est acelerada.   Esta informacin no tiene Theme park manager el consejo del mdico. Asegrese de hacerle al mdico cualquier pregunta que tenga.   Document Released: 05/31/2005 Document Revised: 06/21/2014 Elsevier Interactive Patient Education Yahoo[employer] Inc.

## 2015-08-20 ENCOUNTER — Other Ambulatory Visit: Payer: Self-pay | Admitting: Family Medicine

## 2015-08-20 DIAGNOSIS — R7303 Prediabetes: Secondary | ICD-10-CM | POA: Insufficient documentation

## 2015-08-20 LAB — COMPLETE METABOLIC PANEL WITH GFR
ALK PHOS: 81 U/L (ref 33–115)
ALT: 18 U/L (ref 6–29)
AST: 18 U/L (ref 10–35)
Albumin: 3.8 g/dL (ref 3.6–5.1)
BILIRUBIN TOTAL: 0.5 mg/dL (ref 0.2–1.2)
BUN: 15 mg/dL (ref 7–25)
CALCIUM: 9.6 mg/dL (ref 8.6–10.2)
CHLORIDE: 98 mmol/L (ref 98–110)
CO2: 28 mmol/L (ref 20–31)
Creat: 0.7 mg/dL (ref 0.50–1.10)
Glucose, Bld: 75 mg/dL (ref 65–99)
POTASSIUM: 3.7 mmol/L (ref 3.5–5.3)
Sodium: 135 mmol/L (ref 135–146)
Total Protein: 7.6 g/dL (ref 6.1–8.1)

## 2015-08-20 LAB — HEMOGLOBIN A1C
HEMOGLOBIN A1C: 6.2 % — AB (ref <5.7)
Mean Plasma Glucose: 131 mg/dL — ABNORMAL HIGH (ref <117)

## 2015-08-20 MED ORDER — METFORMIN HCL 500 MG PO TABS: 500.0000 mg | ORAL_TABLET | Freq: Every day | ORAL | Status: DC

## 2015-08-20 NOTE — Progress Notes (Signed)
Reviewed labs. Hemoglobin a1C elevated at 6.2. Also, patient is morbidly obese. Will start Metformin 500 mg with breakfast to assist with insulin resistance. Recommend a lowfat, low carbohydrate diet divided over 5-6 small meals, increase water intake to 6-8 glasses, and 150 minutes per week of cardiovascular exercise. Patient is to follow up in office as previously scheduled.    Meds ordered this encounter  Medications  . metFORMIN (GLUCOPHAGE) 500 MG tablet    Sig: Take 1 tablet (500 mg total) by mouth daily with breakfast.    Dispense:  30 tablet    Refill:  5    Carless Slatten M, FNP

## 2015-08-20 NOTE — Progress Notes (Signed)
Called with Language resources line Interpreter ID [phone]. There was no answer, interpreter left message for patient to call back our office regarding labs. Thanks!

## 2015-08-21 NOTE — Progress Notes (Signed)
Patient came in office today, I advised of elevated hgba1c levels. I advised to start metformin daily, eat low fat / low carb, exercised 150 minutes weekly, and follow up in 1 month. Appointment was scheduled. Thanks!

## 2015-09-19 ENCOUNTER — Ambulatory Visit: Payer: No Typology Code available for payment source | Admitting: Family Medicine

## 2015-09-24 ENCOUNTER — Encounter: Payer: No Typology Code available for payment source | Admitting: Family Medicine

## 2015-09-24 LAB — POCT URINALYSIS DIP (DEVICE)
BILIRUBIN URINE: NEGATIVE
GLUCOSE, UA: NEGATIVE mg/dL
KETONES UR: NEGATIVE mg/dL
Leukocytes, UA: NEGATIVE
Nitrite: NEGATIVE
PH: 6 (ref 5.0–8.0)
PROTEIN: NEGATIVE mg/dL
SPECIFIC GRAVITY, URINE: 1.02 (ref 1.005–1.030)
Urobilinogen, UA: 0.2 mg/dL (ref 0.0–1.0)

## 2015-09-25 NOTE — Progress Notes (Signed)
This encounter was created in error - please disregard.  This encounter was created in error - please disregard.

## 2015-11-06 ENCOUNTER — Other Ambulatory Visit: Payer: Self-pay

## 2015-11-06 DIAGNOSIS — I10 Essential (primary) hypertension: Secondary | ICD-10-CM

## 2015-11-06 DIAGNOSIS — E031 Congenital hypothyroidism without goiter: Secondary | ICD-10-CM

## 2015-11-06 MED ORDER — LISINOPRIL 5 MG PO TABS: 5.0000 mg | ORAL_TABLET | Freq: Every day | ORAL | Status: DC

## 2015-11-06 MED ORDER — LEVOTHYROXINE SODIUM 150 MCG PO TABS: 150.0000 ug | ORAL_TABLET | Freq: Every evening | ORAL | Status: DC

## 2015-11-06 NOTE — Telephone Encounter (Signed)
Refills for thyroid and bp meds sent into pharmacy per patients request. Thanks!

## 2015-11-19 ENCOUNTER — Ambulatory Visit: Payer: No Typology Code available for payment source | Admitting: Family Medicine

## 2015-12-02 ENCOUNTER — Ambulatory Visit (INDEPENDENT_AMBULATORY_CARE_PROVIDER_SITE_OTHER): Payer: No Typology Code available for payment source | Admitting: Family Medicine

## 2015-12-02 ENCOUNTER — Encounter: Payer: Self-pay | Admitting: Family Medicine

## 2015-12-02 VITALS — BP 115/78 | HR 76 | Temp 97.9°F | Resp 16 | Ht 59.0 in | Wt 200.0 lb

## 2015-12-02 DIAGNOSIS — R399 Unspecified symptoms and signs involving the genitourinary system: Secondary | ICD-10-CM

## 2015-12-02 DIAGNOSIS — R3915 Urgency of urination: Secondary | ICD-10-CM

## 2015-12-02 DIAGNOSIS — R300 Dysuria: Secondary | ICD-10-CM

## 2015-12-02 DIAGNOSIS — R8290 Unspecified abnormal findings in urine: Secondary | ICD-10-CM

## 2015-12-02 LAB — POCT URINALYSIS DIP (DEVICE)
Bilirubin Urine: NEGATIVE
Glucose, UA: NEGATIVE mg/dL
KETONES UR: NEGATIVE mg/dL
Nitrite: NEGATIVE
PH: 7 (ref 5.0–8.0)
PROTEIN: NEGATIVE mg/dL
SPECIFIC GRAVITY, URINE: 1.02 (ref 1.005–1.030)
UROBILINOGEN UA: 0.2 mg/dL (ref 0.0–1.0)

## 2015-12-02 MED ORDER — CIPROFLOXACIN HCL 250 MG PO TABS: 250.0000 mg | ORAL_TABLET | Freq: Two times a day (BID) | ORAL | Status: DC

## 2015-12-02 NOTE — Patient Instructions (Addendum)
Start Ciprofloxacin 250 mg twice daily for 7 days  Increase water intake, wipe from front to back, add cranberry juice, empty bladder completely after sexual intercourse. Take antibiotic in its entirety.   Infeccin urinaria  (Urinary Tract Infection)  La infeccin urinaria puede ocurrir en Corporate treasurercualquier lugar del tracto urinario. El tracto urinario es un sistema de drenaje del cuerpo por el que se eliminan los desechos y el exceso de [place]. El tracto urinario est formado por dos riones, dos urteres, la vejiga y Engineer, miningla uretra. Los riones son rganos que tienen forma de frijol. Cada rin tiene aproximadamente el tamao del puo. Estn situados debajo de las Scotts Millscostillas, uno a cada lado de la columna vertebral CAUSAS  La causa de la infeccin son los microbios, que son organismos microscpicos, que incluyen hongos, virus, y bacterias. Estos organismos son tan pequeos que slo pueden verse a travs del microscopio. Las bacterias son los microorganismos que ms comnmente causan infecciones urinarias.  SNTOMAS  Los sntomas pueden variar segn la edad y el sexo del paciente y por la ubicacin de la infeccin. Los sntomas en las mujeres jvenes incluyen la necesidad frecuente e intensa de orinar y una sensacin dolorosa de ardor en la vejiga o en la uretra durante la miccin. Las mujeres y los hombres mayores podrn sentir cansancio, temblores y debilidad y Futures tradersentir dolores musculares y Engineer, miningdolor abdominal. Si tiene Uptonfiebre, puede significar que la infeccin est en los riones. Otros sntomas son dolor en la espalda o en los lados debajo de las Middletowncostillas, nuseas y vmitos.  DIAGNSTICO  Para diagnosticar una infeccin urinaria, el mdico le preguntar acerca de sus sntomas. Genuine Partsambin le solicitar una River Edgemuestra de Comorosorina. La muestra de orina se analiza para Engineer, manufacturingdetectar bacterias y glbulos blancos de Risk managerla sangre. Los glbulos blancos se forman en el organismo para ayudar a Artistcombatir las infecciones.  TRATAMIENTO  Por lo  general, las infecciones urinarias pueden tratarse con medicamentos. Debido a que la Harley-Davidsonmayora de las infecciones son causadas por bacterias, por lo general pueden tratarse con antibiticos. La eleccin del antibitico y la duracin del tratamiento depender de sus sntomas y el tipo de bacteria causante de la infeccin.  INSTRUCCIONES PARA EL CUIDADO EN EL HOGAR   Si le recetaron antibiticos, tmelos exactamente como su mdico le indique. Termine el medicamento aunque se sienta mejor despus de haber tomado slo algunos.  Beba gran cantidad de lquido para mantener la orina de tono claro o color amarillo plido.  Evite la cafena, el t y las [address] Hospital Placebebidas gaseosas. Estas sustancias irritan la vejiga.  Vaciar la vejiga con frecuencia. Evite retener la orina durante largos perodos.  Vace la vejiga antes y despus de Management consultanttener relaciones sexuales.  Despus de mover el intestino, las mujeres deben higienizarse la regin perineal desde adelante hacia atrs. Use slo un papel tissue por vez. SOLICITE ATENCIN MDICA SI:   Siente dolor en la espalda.  Le sube la fiebre.  Los sntomas no mejoran luego de [address] das. SOLICITE ATENCIN MDICA DE INMEDIATO SI:   Siente dolor intenso en la espalda o en la zona inferior del abdomen.  Comienza a sentir escalofros.  Tiene nuseas o vmitos.  Tiene una sensacin continua de quemazn o molestias al ConocoPhillipsorinar. ASEGRESE DE QUE:   Comprende estas instrucciones.  Controlar su enfermedad.  Solicitar ayuda de inmediato si no mejora o empeora.   Esta informacin no tiene Theme park managercomo fin reemplazar el consejo del mdico. Asegrese de hacerle al mdico cualquier pregunta que tenga.   Document  Released: 03/10/2005 Document Revised: 02/23/2012 Elsevier Interactive Patient Education Yahoo! Inc.

## 2015-12-02 NOTE — Progress Notes (Signed)
Subjective:    Patient ID: Judith Kelly, female    DOB: Jan 21, 1971, 45 y.o.   MRN: [id number]  Dysuria  This is a new problem. The current episode started in the past 7 days. The problem occurs every urination. The problem has been unchanged. The quality of the pain is described as aching. The pain is at a severity of 3/10. The pain is mild. There has been no fever. She is sexually active. There is no history of pyelonephritis. Pertinent negatives include no chills, discharge, flank pain, frequency, hematuria, hesitancy, nausea, possible pregnancy, sweats, urgency or vomiting. She has tried nothing for the symptoms. The treatment provided no relief.   Past Medical History  Diagnosis Date  . ALLERGIC RHINITIS 10/04/2008  . FIBRILLATION, ATRIAL 04/05/2007    Annotation: 06/25/04, transient atrial fibrillation secondary to  hyperthyroidism; resolved  Qualifier: History of  By: Sharen HonesGutierrez  MD, Wynona CanesJavier    . Rosacea 10/15/2006    Qualifier: Diagnosis of  By: Barbaraann Barthelankins MD, TurkeyVictoria     Social History   Social History  . Marital Status: Married    Spouse Name: N/A  . Number of Children: N/A  . Years of Education: N/A   Occupational History  . Not on file.   Social History Main Topics  . Smoking status: Never Smoker   . Smokeless tobacco: Never Used  . Alcohol Use: No  . Drug Use: Not on file  . Sexual Activity: Not on file   Other Topics Concern  . Not on file   Social History Narrative   Review of Systems  Constitutional: Negative for chills.  HENT: Negative.   Eyes: Negative.   Respiratory: Negative.   Cardiovascular: Negative.   Gastrointestinal: Negative.  Negative for nausea and vomiting.  Endocrine: Negative.   Genitourinary: Positive for dysuria. Negative for hesitancy, urgency, frequency, hematuria and flank pain.  Musculoskeletal: Negative.   Skin: Negative.   Allergic/Immunologic: Negative.   Neurological: Negative.   Hematological: Negative.    Psychiatric/Behavioral: Negative.       Objective:   Physical Exam  Constitutional: She appears well-developed and well-nourished.  HENT:  Head: Normocephalic and atraumatic.  Right Ear: External ear normal.  Left Ear: External ear normal.  Nose: Nose normal.  Mouth/Throat: Oropharynx is clear and moist.  Eyes: Conjunctivae and EOM are normal. Pupils are equal, round, and reactive to light.  Neck: Normal range of motion. Neck supple.  Cardiovascular: Normal rate, regular rhythm, normal heart sounds and intact distal pulses.   Pulmonary/Chest: Effort normal and breath sounds normal.  Abdominal: Soft. Bowel sounds are normal. There is tenderness in the right lower quadrant and left lower quadrant.  Neurological: She is alert. She has normal reflexes.  Skin: Skin is warm and dry.  Psychiatric: She has a normal mood and affect. Her behavior is normal. Judgment and thought content normal.      BP 115/78 mmHg  Pulse 76  Temp(Src) 97.9 F (36.6 C) (Oral)  Resp 16  Ht 4\' 11"  (1.499 m)  Wt 200 lb (90.719 kg)  BMI 40.37 kg/m2  SpO2 98% Assessment & Plan:  1. Dysuria - POCT urinalysis dip (device)  2. Urinary urgency - POCT urinalysis dip (device)  3. Urinary tract infection symptoms Will start Ciprofloxacin 250 mg BID.  Recommend increasing fluid to 6-8 glasses per day - POCT urinalysis dip (device) - Urine culture - ciprofloxacin (CIPRO) 250 MG tablet; Take 1 tablet (250 mg total) by mouth 2 (two) times daily.  Dispense: 14  tablet; Refill: 0  4. Abnormal urinalysis - Urine culture   RTC: As previously scheduled Massie Maroon, FNP

## 2015-12-05 LAB — URINE CULTURE

## 2015-12-08 ENCOUNTER — Telehealth: Payer: Self-pay

## 2015-12-08 NOTE — Telephone Encounter (Signed)
Called using language resources ID [phone] [facility](Jose) no answer. Left message asking patient to call back regarding labs. Thanks!

## 2015-12-08 NOTE — Telephone Encounter (Signed)
-----   Message from Massie MaroonLachina M Hollis, OregonFNP sent at 12/05/2015 11:30 AM EDT ----- Regarding: lab results Please inform patient that she has E.coli growing in urine. Complete antibiotic in its entirety, increase water intake to 6-8 glasses and add cranberry juice to fluid regimen.   Thanks ----- Message -----    From: [facility] in [facility] Interface    Sent: 12/05/2015  11:26 AM      To: Massie MaroonLachina M Hollis, FNP

## 2015-12-09 NOTE — Telephone Encounter (Signed)
Called using [employer] interpreters Ephriam Knuckles(Christian [phone]#[id number]). NO answer, message was left for patient to call back regarding lab results. Thanks!

## 2015-12-10 NOTE — Telephone Encounter (Signed)
Called using [employer] Interpre[employer] (ID# [id number] Margurite AuerbachAlfredo) No answer, interpreter left message for patient to call back. Thanks!

## 2015-12-11 NOTE — Telephone Encounter (Signed)
Have tried to call no answer. Will mail letter to home advising of labs. Thanks!

## 2015-12-23 ENCOUNTER — Ambulatory Visit: Payer: No Typology Code available for payment source | Admitting: Family Medicine

## 2015-12-23 ENCOUNTER — Encounter: Payer: Self-pay | Admitting: Family Medicine

## 2015-12-23 ENCOUNTER — Ambulatory Visit (INDEPENDENT_AMBULATORY_CARE_PROVIDER_SITE_OTHER): Payer: No Typology Code available for payment source | Admitting: Family Medicine

## 2015-12-23 VITALS — BP 103/62 | HR 79 | Temp 97.7°F | Resp 18 | Ht <= 58 in | Wt 202.0 lb

## 2015-12-23 DIAGNOSIS — E031 Congenital hypothyroidism without goiter: Secondary | ICD-10-CM

## 2015-12-23 DIAGNOSIS — I10 Essential (primary) hypertension: Secondary | ICD-10-CM

## 2015-12-23 DIAGNOSIS — R7303 Prediabetes: Secondary | ICD-10-CM

## 2015-12-23 LAB — LIPID PANEL
CHOLESTEROL: 190 mg/dL (ref 125–200)
HDL: 56 mg/dL (ref >=46)
LDL CALC: 101 mg/dL (ref <130)
TRIGLYCERIDES: 164 mg/dL — AB (ref <150)
Total CHOL/HDL Ratio: 3.4 Ratio (ref <=5.0)
VLDL: 33 mg/dL — AB (ref <30)

## 2015-12-23 LAB — TSH: TSH: 1.32 mIU/L

## 2015-12-23 LAB — GLUCOSE, CAPILLARY: Glucose-Capillary: 87 mg/dL (ref 65–99)

## 2015-12-23 MED ORDER — LISINOPRIL 5 MG PO TABS: 5.0000 mg | ORAL_TABLET | Freq: Every day | ORAL | Status: DC

## 2015-12-23 MED ORDER — LEVOTHYROXINE SODIUM 150 MCG PO TABS: 150.0000 ug | ORAL_TABLET | Freq: Every evening | ORAL | Status: DC

## 2015-12-23 MED ORDER — HYDROCHLOROTHIAZIDE 25 MG PO TABS: 25.0000 mg | ORAL_TABLET | Freq: Every day | ORAL | Status: DC

## 2015-12-23 NOTE — Progress Notes (Signed)
Patient is here for FU  Patient denies pain at this time.  Patient has not been taking metformin since prescribed. Patient is aware of refills being placed on the prescription.

## 2015-12-23 NOTE — Progress Notes (Signed)
Patient ID: Judith Kelly, female   DOB: 07-Aug-1970, 45 y.o.   MRN: [id number]   Judith PillingOralia Kelly, is a 45 y.o. female  [id number]  [id number]  DOB - 07-Aug-1970  CC:  Chief Complaint  Patient presents with  . Follow-up    3 month       HPI: Judith PillingOralia Kelly is a 45 y.o. female here for follow-up hypertension and hypothyroidism. She needs refills on medications for those conditions. When she was here last, she was diagnosed with pre-diabetes, with an A1C of 6.2. She was prescribed metformin which she is not taking. She admits to not following a diabetic diet and does not exercise regularly. Her daughter is here to translate for her at patient request. She makes no spontaneous complaints today. She is on hctz 25 for hypertension and she was placed on lisinopril 5 for renal protection/hypertension. Her BP today is 103/62.  She is on synthroid 150 mcg for hypothyroidism.  She denies tobacco, alcohol or drug use.   Health maintenance:  She is in need of mammogram and PAP smear. She also needs tetanus booster. Will have her return for PAP an TDAP.  No Known Allergies Past Medical History  Diagnosis Date  . ALLERGIC RHINITIS 10/04/2008  . FIBRILLATION, ATRIAL 04/05/2007    Annotation: 06/25/04, transient atrial fibrillation secondary to  hyperthyroidism; resolved  Qualifier: History of  By: Sharen HonesGutierrez  MD, Wynona CanesJavier    . Rosacea 10/15/2006    Qualifier: Diagnosis of  By: Barbaraann Barthelankins MD, [place]     Current Outpatient Prescriptions on File Prior to Visit  Medication Sig Dispense Refill  . metFORMIN (GLUCOPHAGE) 500 MG tablet Take 1 tablet (500 mg total) by mouth daily with breakfast. 30 tablet 5   No current facility-administered medications on file prior to visit.   No family history on file. Social History   Social History  . Marital Status: Married    Spouse Name: N/A  . Number of Children: N/A  . Years of Education: N/A   Occupational History  . Not on file.   Social History  Main Topics  . Smoking status: Never Smoker   . Smokeless tobacco: Never Used  . Alcohol Use: No  . Drug Use: Not on file  . Sexual Activity: Not on file   Other Topics Concern  . Not on file   Social History Narrative    Review of Systems: Constitutional: Negative for fever, chills, appetite change, weight loss. Positive for reported fatigue Skin: Negative for rashes or lesions of concern. HENT: Negative for ear pain, ear discharge.nose bleeds Eyes: Negative for pain, discharge, redness, itching and visual disturbance. Neck: Negative for pain, stiffness Respiratory: Negative for cough, shortness of breath,   Cardiovascular: Negative for chest pain, palpitations and leg swelling. Gastrointestinal: Negative for abdominal pain, nausea, vomiting, diarrhea, constipations Genitourinary: Negative for dysuria, urgency, frequency, hematuria,  Musculoskeletal: Negative for back pain,  joint  swelling, and gait problem.Negative for weakness.Positive for right knee pain Neurological: Negative for dizziness, tremors, seizures, syncope,   light-headedness, numbness and headaches.  Hematological: Negative for easy bruising or bleeding Psychiatric/Behavioral: Negative for depression, anxiety, decreased concentration, confusion   Objective:   Filed Vitals:   12/23/15 1052  BP: 103/62  Pulse: 79  Temp: 97.7 F (36.5 C)  Resp: 18    Physical Exam: Constitutional: Patient appears well-developed and well-nourished. No distress. HENT: Normocephalic, atraumatic, External right and left ear normal. Oropharynx is clear and moist.  Eyes: Conjunctivae and EOM are normal. PERRLA, no scleral  icterus. Neck: Normal ROM. Neck supple. No lymphadenopathy, No thyromegaly. CVS: RRR, S1/S2 +, no murmurs, no gallops, no rubs Pulmonary: Effort and breath sounds normal, no stridor, rhonchi, wheezes, rales.  Abdominal: Soft. Normoactive BS,, no distension, tenderness, rebound or guarding.  Musculoskeletal:  Normal range of motion. No edema and no tenderness.  Neuro: Alert.Normal muscle tone coordination. Non-focal Skin: Skin is warm and dry. No rash noted. Not diaphoretic. No erythema. No pallor. Psychiatric: Normal mood and affect. Behavior, judgment, thought content normal.  Lab Results  Component Value Date   WBC 8.4 08/19/2015   HGB 12.7 08/19/2015   HCT 37.8 08/19/2015   MCV 77.6* 08/19/2015   PLT 316 08/19/2015   Lab Results  Component Value Date   CREATININE 0.70 08/19/2015   BUN 15 08/19/2015   NA 135 08/19/2015   K 3.7 08/19/2015   CL 98 08/19/2015   CO2 28 08/19/2015    Lab Results  Component Value Date   HGBA1C 6.2* 08/19/2015   Lipid Panel     Component Value Date/Time   CHOL 178 10/12/2012 0915   TRIG 116 10/12/2012 0915   HDL 53 10/12/2012 0915   CHOLHDL 3.4 10/12/2012 0915   VLDL 23 10/12/2012 0915   LDLCALC 102* 10/12/2012 0915       Assessment and plan:   1. Essential hypertension, benign  - Lipid panel - lisinopril (PRINIVIL,ZESTRIL) 5 MG tablet; Take 1 tablet (5 mg total) by mouth daily.  Dispense: 90 tablet; Refill: 3 - hydrochlorothiazide (HYDRODIURIL) 25 MG tablet; Take 1 tablet (25 mg total) by mouth daily.  Dispense: 90 tablet; Refill: 3  2. Congenital hypothyroidism without goiter  - TSH - levothyroxine (SYNTHROID, LEVOTHROID) 150 MCG tablet; Take 1 tablet (150 mcg total) by mouth every evening.  Dispense: 90 tablet; Refill: 1   Return in about 6 months (around 06/24/2016).  The patient was given clear instructions to go to ER or return to medical center if symptoms don't improve, worsen or new problems develop. The patient verbalized understanding.    Henrietta Hoover FNP  12/23/2015, 1:01 PM

## 2015-12-24 ENCOUNTER — Telehealth: Payer: Self-pay

## 2015-12-24 ENCOUNTER — Other Ambulatory Visit: Payer: Self-pay | Admitting: Family Medicine

## 2015-12-24 DIAGNOSIS — E031 Congenital hypothyroidism without goiter: Secondary | ICD-10-CM

## 2015-12-24 MED ORDER — LEVOTHYROXINE SODIUM 150 MCG PO TABS: 150.0000 ug | ORAL_TABLET | Freq: Every evening | ORAL | Status: DC

## 2015-12-24 NOTE — Telephone Encounter (Signed)
-----   Message from Henrietta HooverLinda C Bernhardt, NP sent at 12/24/2015  1:19 PM EDT ----- Let her know am refilling the same dose of synthroid.

## 2015-12-24 NOTE — Telephone Encounter (Signed)
Called using pacific interpreters ID [phone], no answer. Left message for patient to return call and left call back number. Thanks!

## 2016-04-05 ENCOUNTER — Ambulatory Visit: Payer: No Typology Code available for payment source | Admitting: Family Medicine

## 2016-05-28 LAB — LAB REPORT - SCANNED: EGFR: 84

## 2016-07-16 ENCOUNTER — Other Ambulatory Visit: Payer: Self-pay

## 2016-07-16 DIAGNOSIS — E031 Congenital hypothyroidism without goiter: Secondary | ICD-10-CM

## 2016-07-16 MED ORDER — LEVOTHYROXINE SODIUM 150 MCG PO TABS: 150.0000 ug | ORAL_TABLET | Freq: Every evening | ORAL | 1 refills | Status: DC

## 2016-09-16 ENCOUNTER — Other Ambulatory Visit: Payer: Self-pay | Admitting: Family Medicine

## 2016-09-16 DIAGNOSIS — R7303 Prediabetes: Secondary | ICD-10-CM

## 2016-09-20 ENCOUNTER — Other Ambulatory Visit: Payer: Self-pay | Admitting: Family Medicine

## 2016-09-20 DIAGNOSIS — R7303 Prediabetes: Secondary | ICD-10-CM

## 2016-12-08 ENCOUNTER — Other Ambulatory Visit: Payer: Self-pay | Admitting: Family Medicine

## 2016-12-08 DIAGNOSIS — R7303 Prediabetes: Secondary | ICD-10-CM

## 2019-01-05 LAB — HM MAMMOGRAPHY

## 2019-01-31 ENCOUNTER — Inpatient Hospital Stay (HOSPITAL_COMMUNITY)
Admission: EM | Admit: 2019-01-31 | Discharge: 2019-02-05 | DRG: 177 | Disposition: A | Discharge status: Home / Self Care | Payer: HRSA Program | Attending: Internal Medicine | Admitting: Internal Medicine

## 2019-01-31 ENCOUNTER — Emergency Department (HOSPITAL_COMMUNITY): Payer: HRSA Program

## 2019-01-31 ENCOUNTER — Encounter (HOSPITAL_COMMUNITY): Payer: Self-pay | Admitting: Student

## 2019-01-31 ENCOUNTER — Other Ambulatory Visit: Payer: Self-pay

## 2019-01-31 DIAGNOSIS — Z79890 Hormone replacement therapy (postmenopausal): Secondary | ICD-10-CM

## 2019-01-31 DIAGNOSIS — J9601 Acute respiratory failure with hypoxia: Secondary | ICD-10-CM

## 2019-01-31 DIAGNOSIS — U071 COVID-19: Principal | ICD-10-CM

## 2019-01-31 DIAGNOSIS — J1282 Pneumonia due to coronavirus disease 2019: Secondary | ICD-10-CM | POA: Diagnosis present

## 2019-01-31 DIAGNOSIS — Z7984 Long term (current) use of oral hypoglycemic drugs: Secondary | ICD-10-CM

## 2019-01-31 DIAGNOSIS — Z6838 Body mass index (BMI) 38.0-38.9, adult: Secondary | ICD-10-CM

## 2019-01-31 DIAGNOSIS — I10 Essential (primary) hypertension: Secondary | ICD-10-CM | POA: Diagnosis present

## 2019-01-31 DIAGNOSIS — E1169 Type 2 diabetes mellitus with other specified complication: Secondary | ICD-10-CM

## 2019-01-31 DIAGNOSIS — J1289 Other viral pneumonia: Secondary | ICD-10-CM | POA: Diagnosis present

## 2019-01-31 DIAGNOSIS — E018 Other iodine-deficiency related thyroid disorders and allied conditions: Secondary | ICD-10-CM | POA: Diagnosis present

## 2019-01-31 DIAGNOSIS — E039 Hypothyroidism, unspecified: Secondary | ICD-10-CM | POA: Diagnosis present

## 2019-01-31 DIAGNOSIS — E669 Obesity, unspecified: Secondary | ICD-10-CM | POA: Diagnosis present

## 2019-01-31 DIAGNOSIS — Z833 Family history of diabetes mellitus: Secondary | ICD-10-CM

## 2019-01-31 DIAGNOSIS — Z79899 Other long term (current) drug therapy: Secondary | ICD-10-CM

## 2019-01-31 DIAGNOSIS — E119 Type 2 diabetes mellitus without complications: Secondary | ICD-10-CM

## 2019-01-31 LAB — SARS CORONAVIRUS 2 BY RT PCR (HOSPITAL ORDER, PERFORMED IN ~~LOC~~ HOSPITAL LAB): SARS Coronavirus 2: POSITIVE — AB

## 2019-01-31 MED ORDER — AEROCHAMBER PLUS FLO-VU MEDIUM MISC
1.0000 | Freq: Once | Status: AC
  Administered 2019-02-01: 1
  Filled 2019-01-31: qty 1

## 2019-01-31 MED ORDER — ALBUTEROL SULFATE HFA 108 (90 BASE) MCG/ACT IN AERS
6.0000 | INHALATION_SPRAY | Freq: Once | RESPIRATORY_TRACT | Status: AC
  Administered 2019-02-01: 6 via RESPIRATORY_TRACT
  Filled 2019-01-31: qty 6.7

## 2019-01-31 NOTE — ED Notes (Signed)
Patient took 1,000 mg home Tylenol for her fever. Patient tearful, oxygen mid to high 90s RA. No distress noted.

## 2019-01-31 NOTE — ED Provider Notes (Addendum)
[facility]-EMERGENCY DEPT Provider Note   CSN: [id number] Arrival date & time: 01/31/19  1936     History   Chief Complaint Chief Complaint  Patient presents with  . Shortness of Breath  . Back Pain    HPI Judith Kelly is a 48 y.o. female with a history of hypertension, migraines, iron deficiency, prediabetes, and BPPV who presents to the ED with complaints "covid symptoms" x 1 week. Patient reports sxs include fever, chills, body aches, nausea, diarrhea (non bloody), bilateral ear pain, dry cough, dyspnea, & chest & abdominal soreness.. Sxs are worse when her fever is high, no alleviating factors. She has been taking tylenol for sxs, took upon ED arrival. Patient's husband has been diagnosed with covid 19.  Denies congestion, sore throat, hemoptysis, vomiting, dysuria, unilateral leg swelling, or syncope.  Spanish interpreter was utilized throughout Audiological scientistencounter.     HPI  Past Medical History:  Diagnosis Date  . ALLERGIC RHINITIS 10/04/2008  . FIBRILLATION, ATRIAL 04/05/2007   Annotation: 06/25/04, transient atrial fibrillation secondary to  hyperthyroidism; resolved  Qualifier: History of  By: Sharen HonesGutierrez  MD, Wynona CanesJavier    . Rosacea 10/15/2006   Qualifier: Diagnosis of  By: Barbaraann Barthelankins MD, [place]      Patient Active Problem List   Diagnosis Date Noted  . Prediabetes 08/20/2015  . Language barrier to communication 08/19/2015  . Iron deficiency 10/18/2013  . Gum lesion 06/29/2013  . Knee pain, left anterior 03/26/2013  . Chiari malformation 12/17/2010  . Migraines 10/22/2010  . DECREASED SEXUAL DESIRE 02/11/2010  . Essential hypertension, benign 12/10/2009  . Benign paroxysmal positional vertigo 11/18/2009  . HYPOTHYROIDISM, POST-RADIOACTIVE IODINE 04/05/2007  . OBESITY NOS 10/15/2006  . 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   OB History   No obstetric history on file.      Home Medications     Prior to Admission medications   Medication Sig Start Date End Date Taking? Authorizing Provider  acetaminophen (TYLENOL) 325 MG tablet Take 650 mg by mouth every 6 (six) hours as needed for fever.    Yes [provider]  azithromycin (ZITHROMAX) 250 MG tablet Take 250 mg by mouth See admin instructions. 01/29/19  Yes [provider]  hydrochlorothiazide (HYDRODIURIL) 25 MG tablet Take 1 tablet (25 mg total) by mouth daily. 12/23/15  Yes Henrietta HooverBernhardt, Linda C, NP  lisinopril (PRINIVIL,ZESTRIL) 5 MG tablet Take 1 tablet (5 mg total) by mouth daily. 12/23/15  Yes Henrietta HooverBernhardt, Linda C, NP  loratadine (CLARITIN) 10 MG tablet Take 10 mg by mouth daily. 12/15/18  Yes [provider]  metFORMIN (GLUCOPHAGE) 500 MG tablet Take 1 tablet (500 mg total) by mouth daily with breakfast. 08/20/15  Yes Massie MaroonHollis, Lachina M, FNP  SYNTHROID 125 MCG tablet Take 125 mcg by mouth every morning. 12/23/18  Yes [provider]  levothyroxine (SYNTHROID, LEVOTHROID) 150 MCG tablet Take 1 tablet (150 mcg total) by mouth every evening. Patient not taking: Reported on 01/31/2019 07/16/16   Henrietta HooverBernhardt, Linda C, NP    Family History No family history on file.  Social History Social History   Tobacco Use  . Smoking status: Never Smoker  . Smokeless tobacco: Never Used  Substance Use Topics  . Alcohol use: No  . Drug use: Not on file     Allergies   Patient has no known allergies.   Review of Systems Review of Systems  Constitutional: Positive for chills and fever.  HENT: Positive for ear pain. Negative for sore throat.   Respiratory: Positive for cough and shortness of breath.   Cardiovascular: Positive for chest pain. Negative for leg swelling.  Gastrointestinal: Positive for abdominal pain, diarrhea and nausea. Negative for anal bleeding, blood in stool, constipation and vomiting.  Genitourinary: Negative for dysuria.  Musculoskeletal: Positive for myalgias.  Neurological: Negative for  syncope.   Physical Exam Updated Vital Signs BP 120/79   Pulse (!) 101   Temp (!) 102.8 F (39.3 C) (Oral)   Resp (!) 22   Ht 5' (1.524 m)   Wt 92.5 kg   SpO2 95%   BMI 39.84 kg/m   Physical Exam Vitals signs and nursing note reviewed.  Constitutional:      General: She is not in acute distress.    Appearance: She is well-developed.  HENT:     Head: Normocephalic and atraumatic.     Right Ear: Ear canal normal. Tympanic membrane is not perforated, erythematous, retracted or bulging.     Left Ear: Ear canal normal. Tympanic membrane is not perforated, erythematous, retracted or bulging.     Ears:     Comments: No mastoid erythema/swelling/tenderness.     Nose:     Right Sinus: No maxillary sinus tenderness or frontal sinus tenderness.     Left Sinus: No maxillary sinus tenderness or frontal sinus tenderness.     Mouth/Throat:     Pharynx: Uvula midline. No oropharyngeal exudate or posterior oropharyngeal erythema.     Comments: Posterior oropharynx is symmetric appearing. Patient tolerating own secretions without difficulty. No trismus. No drooling. No hot potato voice. No swelling beneath the tongue, submandibular compartment is soft.  Eyes:     General:        Right eye: No discharge.        Left eye: No discharge.     Conjunctiva/sclera: Conjunctivae normal.     Pupils: Pupils are equal, round, and reactive to light.  Neck:     Musculoskeletal: Normal range of motion and neck supple. No edema or neck rigidity.  Cardiovascular:     Rate and Rhythm: Normal rate and regular rhythm.     Heart sounds: No murmur.  Pulmonary:     Breath sounds: Normal breath sounds. No wheezing, rhonchi or rales.     Comments: SPO2 88 to 91% on room air throughout my assessment of the patient in the exam room.  I personally ambulated the patient throughout her exam room and she desatted to mid 80s.  She was placed on 2 L via nasal cannula with improvement to the mid 90s. Chest:     Chest  wall: Tenderness (anterior chest wall) present. No deformity or crepitus.  Abdominal:     General: There is no distension.     Palpations: Abdomen is soft.     Tenderness: There is abdominal tenderness (mild generalized). There is no right CVA tenderness, left CVA tenderness, guarding or rebound. Negative signs include Murphy's sign and McBurney's sign.  Musculoskeletal:     Right lower leg: She exhibits no tenderness. No edema.     Left lower leg: She exhibits no tenderness. No edema.  Lymphadenopathy:     Cervical: No cervical adenopathy.  Skin:    General: Skin is warm and dry.     Findings: No rash.  Neurological:     Mental Status: She is alert.  Psychiatric:        Behavior: Behavior normal.    ED Treatments /  Results  Labs (all labs ordered are listed, but only abnormal results are displayed) Labs Reviewed  SARS CORONAVIRUS 2 (HOSPITAL ORDER, [facility]) - Abnormal; Notable for the following components:      Result Value   SARS Coronavirus 2 POSITIVE (*)    All other components within normal limits  CBC WITH DIFFERENTIAL/PLATELET - Abnormal; Notable for the following components:   RBC 5.25 (*)    MCH 25.9 (*)    All other components within normal limits  CULTURE, BLOOD (ROUTINE X 2)  CULTURE, BLOOD (ROUTINE X 2)  LACTIC ACID, PLASMA  LACTIC ACID, PLASMA  COMPREHENSIVE METABOLIC PANEL  D-DIMER, QUANTITATIVE (NOT AT [facility])  PROCALCITONIN  LACTATE DEHYDROGENASE  FERRITIN  TRIGLYCERIDES  FIBRINOGEN  C-REACTIVE PROTEIN  BRAIN NATRIURETIC PEPTIDE  I-STAT BETA HCG BLOOD, ED ([facility], [facility], [facility] ONLY)    EKG EKG Interpretation  Date/Time:  Thursday February 01 2019 00:21:23 EDT Ventricular Rate:  93 PR Interval:    QRS Duration: 84 QT Interval:  337 QTC Calculation: 420 R Axis:   0 Text Interpretation:  Sinus rhythm Low voltage, precordial leads Baseline wander in lead(s) II III aVF No previous ECGs available Confirmed by Molpus, John [phone]) on  02/01/2019 12:27:12 AM   Radiology No results found.  Procedures .Critical Care Performed by: Amaryllis Dyke, PA-C Authorized by: Amaryllis Dyke, PA-C    .CRITICAL CARE Performed by: Kennith Maes   Total critical care time: 30 minutes  Critical care time was exclusive of separately billable procedures and treating other patients.  Critical care was necessary to treat or prevent imminent or life-threatening deterioration.  Critical care was time spent personally by me on the following activities: development of treatment plan with patient and/or surrogate as well as nursing, discussions with consultants, evaluation of patient's response to treatment, examination of patient, obtaining history from patient or surrogate, ordering and performing treatments and interventions, ordering and review of laboratory studies, ordering and review of radiographic studies, pulse oximetry and re-evaluation of patient's condition.  (including critical care time)  Medications Ordered in ED Medications  albuterol (VENTOLIN HFA) 108 (90 Base) MCG/ACT inhaler 6 puff (6 puffs Inhalation Given 02/01/19 0023)  AeroChamber Plus Flo-Vu Medium MISC 1 each (1 each Other Given 02/01/19 0023)     Initial Impression / Assessment and Plan / ED Course  I have reviewed the triage vital signs and the nursing notes.  Pertinent labs & imaging results that were available during my care of the patient were reviewed by me and considered in my medical decision making (see chart for details).   Patient presents to the emergency department with fever associated with respiratory and GI symptoms.  She is notably febrile on arrival to 102.8 with likely resultant mild tachycardia-heart rate normalized upon my exam.  She is noted to be hypoxic with resting SPO2 88 to 91% on room air.  Breath sounds are clear.  She has some chest and abdominal tenderness, no peritoneal signs.  COVID-19 testing resulted prior  to my assessment of the patient and is positive.  Chest x-ray with cardiomegaly, vascular congestion, & mild pulmonary edema- lungs clear, no hx of CHF, no significant LE edema. Will proceed w/ labs & EKG anticipate admission.  CBC: No leukocytosis or leukopenia.  No anemia. CMP: Mild hyperglycemia at 108.  Mild hypocalcemia at 8.7.  No significant electrolyte derangement.  Renal function and LFTs are preserved. Lactic acid: Within normal limits. Pregnancy test: Negative BNP: Within normal  limits Trigllycerides: Within normal limits LDH: Mildly elevated. EKG: no STEMI.   Overall suspect patient's symptoms are related to COVID-19. Abdomen is without peritoneal signs, do not suspect acute surgical process. Chest pain reproducible with anterior chest wall palpation, feel PE, ACS, or dissection is unlikely.  Given COVID-19 with hypoxia throughout my physical exam at rest with SPO2 consistently at 88 to 91% feel patient warrants admission.  Consult placed to hospitalist service.  01:22: CONSULT: Discussed case with hospitalist Dr. Robb Matarrtiz- accepts admission.   Findings and plan of care discussed with supervising physician Dr. Rodena MedinMessick who is in agreement.   Judith Kelly was evaluated in Emergency Department on 02/01/2019 for the symptoms described in the history of present illness. He/she was evaluated in the context of the global COVID-19 pandemic, which necessitated consideration that the patient might be at risk for infection with the SARS-CoV-2 virus that causes COVID-19. Institutional protocols and algorithms that pertain to the evaluation of patients at risk for COVID-19 are in a state of rapid change based on information released by regulatory bodies including the CDC and federal and state organizations. These policies and algorithms were followed during the patient's care in the ED.   Final Clinical Impressions(s) / ED Diagnoses   Final diagnoses:  COVID-19  Acute respiratory  failure with hypoxia [facility](HCC)    ED Discharge Orders    None       Cherly Andersonetrucelli, Avory Rahimi R, PA-C 02/01/19 0125    Cherly Andersonetrucelli, Davieon Stockham R, PA-C 02/01/19 0125    Wynetta FinesMessick, Peter C, MD 02/12/19 [phone]

## 2019-01-31 NOTE — ED Notes (Signed)
Date and time results received: 01/31/19 2142   Test: COVID + Critical Value: Positive   Name of Provider Notified: Sam, [place]   Orders Received? Or Actions Taken?: Will continue to wait for new orders and monitor patient.

## 2019-01-31 NOTE — ED Triage Notes (Signed)
Pt arrived to ED from home with complaints of SOB and back pain. Pt states her husband tested positive for COVID last week and she started showing symptoms 5 days ago.

## 2019-02-01 DIAGNOSIS — J1289 Other viral pneumonia: Secondary | ICD-10-CM | POA: Diagnosis present

## 2019-02-01 DIAGNOSIS — E039 Hypothyroidism, unspecified: Secondary | ICD-10-CM | POA: Diagnosis present

## 2019-02-01 DIAGNOSIS — Z7984 Long term (current) use of oral hypoglycemic drugs: Secondary | ICD-10-CM | POA: Diagnosis not present

## 2019-02-01 DIAGNOSIS — Z79890 Hormone replacement therapy (postmenopausal): Secondary | ICD-10-CM | POA: Diagnosis not present

## 2019-02-01 DIAGNOSIS — Z6838 Body mass index (BMI) 38.0-38.9, adult: Secondary | ICD-10-CM | POA: Diagnosis not present

## 2019-02-01 DIAGNOSIS — I10 Essential (primary) hypertension: Secondary | ICD-10-CM | POA: Diagnosis present

## 2019-02-01 DIAGNOSIS — U071 COVID-19: Secondary | ICD-10-CM | POA: Diagnosis present

## 2019-02-01 DIAGNOSIS — E119 Type 2 diabetes mellitus without complications: Secondary | ICD-10-CM | POA: Diagnosis present

## 2019-02-01 DIAGNOSIS — J9601 Acute respiratory failure with hypoxia: Secondary | ICD-10-CM | POA: Diagnosis present

## 2019-02-01 DIAGNOSIS — Z833 Family history of diabetes mellitus: Secondary | ICD-10-CM | POA: Diagnosis not present

## 2019-02-01 DIAGNOSIS — E669 Obesity, unspecified: Secondary | ICD-10-CM | POA: Diagnosis present

## 2019-02-01 DIAGNOSIS — Z79899 Other long term (current) drug therapy: Secondary | ICD-10-CM | POA: Diagnosis not present

## 2019-02-01 LAB — COMPREHENSIVE METABOLIC PANEL
ALT: 27 U/L (ref 0–44)
AST: 30 U/L (ref 15–41)
Albumin: 3.5 g/dL (ref 3.5–5.0)
Alkaline Phosphatase: 87 U/L (ref 38–126)
Anion gap: 11 (ref 5–15)
BUN: 15 mg/dL (ref 6–20)
CO2: 24 mmol/L (ref 22–32)
Calcium: 8.7 mg/dL — ABNORMAL LOW (ref 8.9–10.3)
Chloride: 100 mmol/L (ref 98–111)
Creatinine, Ser: 0.92 mg/dL (ref 0.44–1.00)
GFR calc Af Amer: >60 mL/min (ref >60)
GFR calc non Af Amer: >60 mL/min (ref >60)
Glucose, Bld: 108 mg/dL — ABNORMAL HIGH (ref 70–99)
Potassium: 3.5 mmol/L (ref 3.5–5.1)
Sodium: 135 mmol/L (ref 135–145)
Total Bilirubin: 0.5 mg/dL (ref 0.3–1.2)
Total Protein: 8.1 g/dL (ref 6.5–8.1)

## 2019-02-01 LAB — MAGNESIUM: Magnesium: 1.7 mg/dL (ref 1.7–2.4)

## 2019-02-01 LAB — CBC
HCT: 45.1 % (ref 36.0–46.0)
Hemoglobin: 14.2 g/dL (ref 12.0–15.0)
MCH: 26.1 pg (ref 26.0–34.0)
MCHC: 31.5 g/dL (ref 30.0–36.0)
MCV: 82.9 fL (ref 80.0–100.0)
Platelets: 214 10*3/uL (ref 150–400)
RBC: 5.44 MIL/uL — ABNORMAL HIGH (ref 3.87–5.11)
RDW: 15.5 % (ref 11.5–15.5)
WBC: 5.2 10*3/uL (ref 4.0–10.5)
nRBC: 0 % (ref 0.0–0.2)

## 2019-02-01 LAB — LACTATE DEHYDROGENASE: LDH: 243 U/L — ABNORMAL HIGH (ref 98–192)

## 2019-02-01 LAB — CBC WITH DIFFERENTIAL/PLATELET
Abs Immature Granulocytes: 0.02 10*3/uL (ref 0.00–0.07)
Basophils Absolute: 0 10*3/uL (ref 0.0–0.1)
Basophils Relative: 0 %
Eosinophils Absolute: 0 10*3/uL (ref 0.0–0.5)
Eosinophils Relative: 0 %
HCT: 42.3 % (ref 36.0–46.0)
Hemoglobin: 13.6 g/dL (ref 12.0–15.0)
Immature Granulocytes: 0 %
Lymphocytes Relative: 27 %
Lymphs Abs: 1.4 10*3/uL (ref 0.7–4.0)
MCH: 25.9 pg — ABNORMAL LOW (ref 26.0–34.0)
MCHC: 32.2 g/dL (ref 30.0–36.0)
MCV: 80.6 fL (ref 80.0–100.0)
Monocytes Absolute: 0.2 10*3/uL (ref 0.1–1.0)
Monocytes Relative: 4 %
Neutro Abs: 3.5 10*3/uL (ref 1.7–7.7)
Neutrophils Relative %: 69 %
Platelets: 207 10*3/uL (ref 150–400)
RBC: 5.25 MIL/uL — ABNORMAL HIGH (ref 3.87–5.11)
RDW: 14.9 % (ref 11.5–15.5)
WBC: 5.1 10*3/uL (ref 4.0–10.5)
nRBC: 0 % (ref 0.0–0.2)

## 2019-02-01 LAB — I-STAT BETA HCG BLOOD, ED (MC, WL, AP ONLY): I-stat hCG, quantitative: <5 m[IU]/mL (ref <5)

## 2019-02-01 LAB — GLUCOSE, CAPILLARY
Glucose-Capillary: 115 mg/dL — ABNORMAL HIGH (ref 70–99)
Glucose-Capillary: 187 mg/dL — ABNORMAL HIGH (ref 70–99)
Glucose-Capillary: 180 mg/dL — ABNORMAL HIGH (ref 70–99)
Glucose-Capillary: 159 mg/dL — ABNORMAL HIGH (ref 70–99)

## 2019-02-01 LAB — CREATININE, SERUM
Creatinine, Ser: 0.92 mg/dL (ref 0.44–1.00)
GFR calc Af Amer: >60 mL/min (ref >60)
GFR calc non Af Amer: >60 mL/min (ref >60)

## 2019-02-01 LAB — LACTIC ACID, PLASMA: Lactic Acid, Venous: 1.1 mmol/L (ref 0.5–1.9)

## 2019-02-01 LAB — BRAIN NATRIURETIC PEPTIDE: B Natriuretic Peptide: 9.3 pg/mL (ref 0.0–100.0)

## 2019-02-01 LAB — FIBRINOGEN: Fibrinogen: 574 mg/dL — ABNORMAL HIGH (ref 210–475)

## 2019-02-01 LAB — C-REACTIVE PROTEIN: CRP: 9 mg/dL — ABNORMAL HIGH (ref <1.0)

## 2019-02-01 LAB — FERRITIN: Ferritin: 74 ng/mL (ref 11–307)

## 2019-02-01 LAB — D-DIMER, QUANTITATIVE: D-Dimer, Quant: 0.55 ug/mL-FEU — ABNORMAL HIGH (ref 0.00–0.50)

## 2019-02-01 LAB — PROCALCITONIN: Procalcitonin: <0.1 ng/mL

## 2019-02-01 LAB — PHOSPHORUS: Phosphorus: 3.5 mg/dL (ref 2.5–4.6)

## 2019-02-01 LAB — TRIGLYCERIDES: Triglycerides: 115 mg/dL (ref <150)

## 2019-02-01 MED ORDER — METFORMIN HCL 500 MG PO TABS: 500.0000 mg | ORAL_TABLET | Freq: Two times a day (BID) | ORAL | Status: DC

## 2019-02-01 MED ORDER — LISINOPRIL 10 MG PO TABS
5.0000 mg | ORAL_TABLET | Freq: Every day | ORAL | Status: DC
  Administered 2019-02-01 – 2019-02-05 (×5): 5 mg via ORAL
  Filled 2019-02-01 (×5): qty 1

## 2019-02-01 MED ORDER — LEVOTHYROXINE SODIUM 75 MCG PO TABS
125.0000 ug | ORAL_TABLET | Freq: Every day | ORAL | Status: DC
  Administered 2019-02-01 – 2019-02-05 (×5): 125 ug via ORAL
  Filled 2019-02-01 (×5): qty 1

## 2019-02-01 MED ORDER — LORATADINE 10 MG PO TABS
10.0000 mg | ORAL_TABLET | Freq: Every day | ORAL | Status: DC
  Administered 2019-02-01 – 2019-02-05 (×5): 10 mg via ORAL
  Filled 2019-02-01 (×5): qty 1

## 2019-02-01 MED ORDER — INSULIN ASPART 100 UNIT/ML ~~LOC~~ SOLN
0.0000 [IU] | Freq: Three times a day (TID) | SUBCUTANEOUS | Status: DC
  Administered 2019-02-01 – 2019-02-02 (×3): 4 [IU] via SUBCUTANEOUS
  Administered 2019-02-02 – 2019-02-03 (×4): 3 [IU] via SUBCUTANEOUS
  Administered 2019-02-03 – 2019-02-04 (×2): 4 [IU] via SUBCUTANEOUS
  Administered 2019-02-04 – 2019-02-05 (×2): 3 [IU] via SUBCUTANEOUS

## 2019-02-01 MED ORDER — DEXAMETHASONE SODIUM PHOSPHATE 10 MG/ML IJ SOLN
6.0000 mg | INTRAMUSCULAR | Status: DC
  Administered 2019-02-01: 07:00:00 6 mg via INTRAVENOUS
  Filled 2019-02-01: qty 1

## 2019-02-01 MED ORDER — AZITHROMYCIN 250 MG PO TABS: 250.0000 mg | ORAL_TABLET | Freq: Every day | ORAL | Status: DC

## 2019-02-01 MED ORDER — SODIUM CHLORIDE 0.9 % IV SOLN
200.0000 mg | Freq: Once | INTRAVENOUS | Status: AC
  Administered 2019-02-01: 10:00:00 200 mg via INTRAVENOUS
  Filled 2019-02-01: qty 40

## 2019-02-01 MED ORDER — MAGNESIUM SULFATE 2 GM/50ML IV SOLN
2.0000 g | Freq: Once | INTRAVENOUS | Status: AC
  Administered 2019-02-01: 2 g via INTRAVENOUS
  Filled 2019-02-01: qty 50

## 2019-02-01 MED ORDER — POTASSIUM CHLORIDE IN NACL 20-0.45 MEQ/L-% IV SOLN
INTRAVENOUS | Status: AC
  Administered 2019-02-01: 07:00:00 via INTRAVENOUS
  Filled 2019-02-01: qty 1000

## 2019-02-01 MED ORDER — TOCILIZUMAB 400 MG/20ML IV SOLN
8.0000 mg/kg | Freq: Once | INTRAVENOUS | Status: DC
  Filled 2019-02-01: qty 37

## 2019-02-01 MED ORDER — METHYLPREDNISOLONE SODIUM SUCC 40 MG IJ SOLR
40.0000 mg | Freq: Two times a day (BID) | INTRAMUSCULAR | Status: DC
  Administered 2019-02-01 – 2019-02-05 (×8): 40 mg via INTRAVENOUS
  Filled 2019-02-01 (×7): qty 1

## 2019-02-01 MED ORDER — GUAIFENESIN-DM 100-10 MG/5ML PO SYRP: 10.0000 mL | ORAL_SOLUTION | ORAL | Status: DC | PRN

## 2019-02-01 MED ORDER — SODIUM CHLORIDE 0.9 % IV SOLN
100.0000 mg | INTRAVENOUS | Status: AC
  Administered 2019-02-02 – 2019-02-05 (×4): 100 mg via INTRAVENOUS
  Filled 2019-02-01 (×4): qty 20

## 2019-02-01 MED ORDER — SODIUM CHLORIDE 0.9 % IV SOLN
2.0000 g | INTRAVENOUS | Status: DC
  Administered 2019-02-01: 2 g via INTRAVENOUS
  Filled 2019-02-01: qty 20

## 2019-02-01 MED ORDER — ENOXAPARIN SODIUM 40 MG/0.4ML ~~LOC~~ SOLN: 40.0000 mg | SUBCUTANEOUS | Status: DC

## 2019-02-01 MED ORDER — ENOXAPARIN SODIUM 40 MG/0.4ML ~~LOC~~ SOLN
40.0000 mg | SUBCUTANEOUS | Status: DC
  Administered 2019-02-01 – 2019-02-05 (×5): 40 mg via SUBCUTANEOUS
  Filled 2019-02-01 (×5): qty 0.4

## 2019-02-01 MED ORDER — HYDROCOD POLST-CPM POLST ER 10-8 MG/5ML PO SUER: 5.0000 mL | Freq: Two times a day (BID) | ORAL | Status: DC | PRN

## 2019-02-01 NOTE — Progress Notes (Signed)
Pharmacy Brief Note   O:  ALT: 27 CXR: chest radiograph unofficially showed interstitial infiltrates and cardiomegaly SpO2: currently requiring 2 L via nasal cannula.   A/P:  Patient meets requirements for remdesivir therapy.  Will start  remdesivir 200 mg IV x 1  followed by 100 mg IV daily x 4 days.  Monitor ALT  Royetta Asal, PharmD, BCPS 02/01/2019 6:51 AM

## 2019-02-01 NOTE — Progress Notes (Signed)
PROGRESS NOTE                                                                                                                                                                                                             Patient Demographics:    Judith HaileyOralia Guajardo Kelly, is a 48 y.o. female, DOB - [birth year], ZOX:[id number]  Outpatient Primary MD for the patient is Massie MaroonHollis, Lachina M, FNP   Admit date - 01/31/2019   LOS - 0  Chief Complaint  Patient presents with  . Shortness of Breath       Brief Narrative: Patient is a 48 y.o. female with PMHx of HTN, hypothyroidism, DM-2-presented with several days history of worsening cough, shortness of breath-her husband recently had COVID-19-she was subsequently found to have acute hypoxic respiratory failure secondary to COVID-19 pneumonia.   Subjective:    Judith Haileyralia Guajardo Kelly today feels somewhat better-continues to cough.  She is on 2 L of oxygen.   Assessment  & Plan :   Acute Hypoxic Resp Failure due to Covid 19 Viral pneumonia: Remains stable on 2 L of oxygen.  Continue steroids and Remdesivir.  If hypoxemia worsens-we will dose her with Actemra.  Note: Rationale, risks, benefits of Actemra discussed in great detail.  She is not sure about history of hepatitis B-we will send out a surface antigen.  However if she worsens-she has consented to the use of Actemra.  Fever: febrile  O2 requirements: On 2 l/m (2 L on admission)  COVID-19 Labs: Recent Labs    01/31/19 2338  DDIMER 0.55*  FERRITIN 74  LDH 243*  CRP 9.0*    COVID-19 Medications: Steroids: Decadron 8/19 x 1, Solu-Medrol 8/20>> Remdesivir: 8/20>> Actemra: See above Convalescent Plasma:N/A Research Studies:N/A  Other medications: Diuretics: Euvolemic-no indication for Lasix. Antibiotics:Not needed as no evidence of bacterial infection (procalcitonin within normal range)  Prone/Incentive Spirometry:  encouraged patient to lie prone for 3-4 hours at a time for a total of 16 hours a day, and to encourage incentive spirometry use 3-4/hour.  DVT Prophylaxis  :  Lovenox   DM-2: CBGs currently stable on SSI-expect some amount of hyperglycemia as patient on steroids.  Follow for now.  Continue to hold all oral hypoglycemic agents  HTN: Controlled-continue lisinopril  Hypothyroidism: Continue with Synthroid  Obesity: Estimated body mass index is 38.92 kg/m  as calculated from the following:   Height as of this encounter: 5' (1.524 m).   Weight as of this encounter: 90.4 kg.   ABG: No results found for: PHART, PCO2ART, PO2ART, HCO3, TCO2, ACIDBASEDEF, O2SAT  Vent Settings: N/A  Condition - Stable  Family Communication: Left voicemail for family-at number listed in facesheet  Code Status :  Full Code  Diet :  Diet Order            Diet heart healthy/carb modified Room service appropriate? Yes; Fluid consistency: Thin  Diet effective now               Disposition Plan  :  Remain hospitalized-needs 5 days of inpatient therapy with remdesivir  Consults  :  None  Procedures  :  None  Antibiotics  :    Anti-infectives (From admission, onward)   Start     Dose/Rate Route Frequency Ordered Stop   02/02/19 0800  remdesivir 100 mg in sodium chloride 0.9 % 250 mL IVPB     100 mg 500 mL/hr over 30 Minutes Intravenous Every 24 hours 02/01/19 0646 02/06/19 0759   02/01/19 1000  azithromycin (ZITHROMAX) tablet 250 mg  Status:  Discontinued     250 mg Oral Daily 02/01/19 0553 02/01/19 0703   02/01/19 0800  remdesivir 200 mg in sodium chloride 0.9 % 250 mL IVPB     200 mg 500 mL/hr over 30 Minutes Intravenous Once 02/01/19 0646     02/01/19 0600  cefTRIAXone (ROCEPHIN) 2 g in sodium chloride 0.9 % 100 mL IVPB  Status:  Discontinued     2 g 200 mL/hr over 30 Minutes Intravenous Every 24 hours 02/01/19 0515 02/01/19 0703      Inpatient Medications  Scheduled Meds: . enoxaparin  (LOVENOX) injection  40 mg Subcutaneous Q24H  . insulin aspart  0-20 Units Subcutaneous TID WC  . levothyroxine  125 mcg Oral Daily  . lisinopril  5 mg Oral Daily  . loratadine  10 mg Oral Daily  . methylPREDNISolone (SOLU-MEDROL) injection  40 mg Intravenous Q12H   Continuous Infusions: . magnesium sulfate bolus IVPB    . remdesivir 200 mg in NS 250 mL     Followed by  . [START ON 02/02/2019] remdesivir 100 mg in NS 250 mL     PRN Meds:.chlorpheniramine-HYDROcodone, guaiFENesin-dextromethorphan   Time Spent in minutes  25  See all Orders from today for further details   Oren Binet M.D on 02/01/2019 at 9:33 AM  To page go to [email or web address] - use universal password  [facility] -  Office  [phone]    Objective:   Vitals:   02/01/19 0017 02/01/19 0023 02/01/19 0025 02/01/19 0200  BP: 102/72   95/68  Pulse: 98   98  Resp: (!) 21   18  Temp:   99.3 F (37.4 C) 99.4 F (37.4 C)  TempSrc:   Oral   SpO2: 94% 95%  94%  Weight:    90.4 kg  Height:    5' (1.524 m)    Wt Readings from Last 3 Encounters:  02/01/19 90.4 kg  12/23/15 91.6 kg  12/02/15 90.7 kg     Intake/Output Summary (Last 24 hours) at 02/01/2019 0933 Last data filed at 02/01/2019 2440 Gross per 24 hour  Intake 0 ml  Output -  Net 0 ml     Physical Exam Gen Exam:Alert awake-not in any distress HEENT:atraumatic, normocephalic Chest: B/L clear to auscultation anteriorly CVS:S1S2 regular Abdomen:soft non  tender, non distended Extremities:no edema Neurology: Non focal Skin: no rash   Data Review:    CBC Recent Labs  Lab 01/31/19 2338  WBC 5.1  HGB 13.6  HCT 42.3  PLT 207  MCV 80.6  MCH 25.9*  MCHC 32.2  RDW 14.9  LYMPHSABS 1.4  MONOABS 0.2  EOSABS 0.0  BASOSABS 0.0    Chemistries  Recent Labs  Lab 01/31/19 2338 02/01/19 0020  NA 135  --   K 3.5  --   CL 100  --   CO2 24  --   GLUCOSE 108*  --   BUN 15  --   CREATININE 0.92  --   CALCIUM 8.7*  --   MG  --   1.7  AST 30  --   ALT 27  --   ALKPHOS 87  --   BILITOT 0.5  --    ------------------------------------------------------------------------------------------------------------------ Recent Labs    01/31/19 2338  TRIG 115    Lab Results  Component Value Date   HGBA1C 6.2 (H) 08/19/2015   ------------------------------------------------------------------------------------------------------------------ No results for input(s): TSH, T4TOTAL, T3FREE, THYROIDAB in the last 72 hours.  Invalid input(s): FREET3 ------------------------------------------------------------------------------------------------------------------ Recent Labs    01/31/19 2338  FERRITIN 74    Coagulation profile No results for input(s): INR, PROTIME in the last 168 hours.  Recent Labs    01/31/19 2338  DDIMER 0.55*    Cardiac Enzymes No results for input(s): CKMB, TROPONINI, MYOGLOBIN in the last 168 hours.  Invalid input(s): CK ------------------------------------------------------------------------------------------------------------------    Component Value Date/Time   BNP 9.3 01/31/2019 2338    Micro Results Recent Results (from the past 240 hour(s))  SARS Coronavirus 2 [facility](Hospital order, Performed in [facility] hospital lab) Nasopharyngeal Nasopharyngeal Swab     Status: Abnormal   Collection Time: 01/31/19  8:30 PM   Specimen: Nasopharyngeal Swab  Result Value Ref Range Status   SARS Coronavirus 2 POSITIVE (A) NEGATIVE Final    Comment: CRITICAL RESULT CALLED TO, READ BACK BY AND VERIFIED WITH: RN Fenton FoyC INMAN AT 2142 01/31/19 CRUICKSHANK A (NOTE) If result is NEGATIVE SARS-CoV-2 target nucleic acids are NOT DETECTED. The SARS-CoV-2 RNA is generally detectable in upper and lower  respiratory specimens during the acute phase of infection. The lowest  concentration of SARS-CoV-2 viral copies this assay can detect is 250  copies / mL. A negative result does not preclude SARS-CoV-2 infection   and should not be used as the sole basis for treatment or other  patient management decisions.  A negative result may occur with  improper specimen collection / handling, submission of specimen other  than nasopharyngeal swab, presence of viral mutation(s) within the  areas targeted by this assay, and inadequate number of viral copies  (<250 copies / mL). A negative result must be combined with clinical  observations, patient history, and epidemiological information. If result is POSITIVE SARS-CoV-2 target nucleic acids ar e DETECTED. The SARS-CoV-2 RNA is generally detectable in upper and lower  respiratory specimens during the acute phase of infection.  Positive  results are indicative of active infection with SARS-CoV-2.  Clinical  correlation with patient history and other diagnostic information is  necessary to determine patient infection status.  Positive results do  not rule out bacterial infection or co-infection with other viruses. If result is PRESUMPTIVE POSTIVE SARS-CoV-2 nucleic acids MAY BE PRESENT.   A presumptive positive result was obtained on the submitted specimen  and confirmed on repeat testing.  While 2019 novel coronavirus  (  SARS-CoV-2) nucleic acids may be present in the submitted sample  additional confirmatory testing may be necessary for epidemiological  and / or clinical management purposes  to differentiate between  SARS-CoV-2 and other Sarbecovirus currently known to infect humans.  If clinically indicated additional testing with an alternate test  methodolog y [phone]([id number]) is advised. The SARS-CoV-2 RNA is generally  detectable in upper and lower respiratory specimens during the acute  phase of infection. The expected result is Negative. Fact Sheet for Patients:  BoilerBrush.com.cyhttps://www.fda.gov/media/136312/download Fact Sheet for Healthcare Providers: https://pope.com/https://www.fda.gov/media/136313/download This test is not yet approved or cleared by the [place] FDA and has  been authorized for detection and/or diagnosis of SARS-CoV-2 by FDA under an Emergency Use Authorization (EUA).  This EUA will remain in effect (meaning this test can be used) for the duration of the COVID-19 declaration under Section 564(b)(1) of the Act, 21 U.S.C. section 360bbb-3(b)(1), unless the authorization is terminated or revoked sooner. Performed at [facility], [address] [place], [place] [address]   Blood Culture (routine x 2)     Status: None (Preliminary result)   Collection Time: 01/31/19 11:43 PM   Specimen: BLOOD LEFT FOREARM  Result Value Ref Range Status   Specimen Description   Final    BLOOD LEFT FOREARM Performed at [facility], [address] [place], [place] [address]    Special Requests   Final    BOTTLES DRAWN AEROBIC ONLY Blood Culture adequate volume Performed at [facility], [address] [place], [place] [address]    Culture PENDING  Incomplete   Report Status PENDING  Incomplete    Radiology Reports No results found.

## 2019-02-01 NOTE — Progress Notes (Signed)
Family updated, agreeable with plan of care. 

## 2019-02-01 NOTE — H&P (Signed)
History and Physical    Judith Kelly ZOX:[id number] DOB: 04/02/1971 DOA: 01/31/2019  PCP: Massie MaroonHollis, Lachina M, FNP   Patient coming from: Home.  I have personally briefly reviewed patient's old medical [facility] in [facility]  Chief Complaint: Shortness of breath.  HPI: Judith Kelly is a 48 y.o. female with medical history significant of allergic rhinitis, transient atrial fibrillation, rosacea, obesity, hypertension, type 2 diabetes, hypothyroidism who is coming to the emergency department with complaints of shortness of breath associated with dry cough, body aches, fatigue, malaise, decreased oral intake since Thursday last week after she was exposed to her a spouse which had tested positive for COVID-19 days before.  She denies headache, rhinorrhea, chest pain, palpitations, diaphoresis, PND, orthopnea or recent pitting edema of the lower extremities.  No abdominal pain, nausea or vomiting, diarrhea constipation, melena or hematochezia.  Denies dysuria, frequency or hematuria.  Denies polyuria, polydipsia, polyphagia or blurred vision.  ED Course: Initial vital signs temperature 102.8 F, pulse 101, respirations 22, blood pressure 122/85 mmHg and O2 sat 94% on room air.  The patient was given 6 puffs of Ventolin via MDI and supplemental oxygen.  She is currently requiring 2 L via nasal cannula.  Her chest radiograph unofficially showed interstitial infiltrates and cardiomegaly.  White count was 5.1, hemoglobin 13.6 g/dL and platelets [id number].  LDH was 243.  Her CMP shows a glucose of 180 and a calcium of 8.7 mg/dL, all other values are within expected range.  Calcium is normal when corrected to an albumin of 3.5 g/dL.  BNP, triglycerides, ferritin, procalcitonin and phosphorus are within normal limits.  Magnesium is borderline low at 1.7 mg/dL.  Review of Systems: As per HPI otherwise 10 point review of systems negative.   Past Medical History:  Diagnosis Date  . ALLERGIC  RHINITIS 10/04/2008  . FIBRILLATION, ATRIAL 04/05/2007   Annotation: 06/25/04, transient atrial fibrillation secondary to  hyperthyroidism; resolved  Qualifier: History of  By: Sharen HonesGutierrez  MD, Wynona CanesJavier    . Rosacea 10/15/2006   Qualifier: Diagnosis of  By: Barbaraann Barthelankins MD, [place]      History reviewed. No pertinent surgical history.   reports that she has never smoked. She has never used smokeless tobacco. She reports that she does not drink alcohol. No history on file for drug.  No Known Allergies  Family medical history Mother DM  Prior to Admission medications   Medication Sig Start Date End Date Taking? Authorizing Provider  acetaminophen (TYLENOL) 325 MG tablet Take 650 mg by mouth every 6 (six) hours as needed for fever.    Yes [provider]  azithromycin (ZITHROMAX) 250 MG tablet Take 250 mg by mouth See admin instructions. 01/29/19  Yes [provider]  hydrochlorothiazide (HYDRODIURIL) 25 MG tablet Take 1 tablet (25 mg total) by mouth daily. 12/23/15  Yes Henrietta HooverBernhardt, Linda C, NP  lisinopril (PRINIVIL,ZESTRIL) 5 MG tablet Take 1 tablet (5 mg total) by mouth daily. 12/23/15  Yes Henrietta HooverBernhardt, Linda C, NP  loratadine (CLARITIN) 10 MG tablet Take 10 mg by mouth daily. 12/15/18  Yes [provider]  metFORMIN (GLUCOPHAGE) 500 MG tablet Take 1 tablet (500 mg total) by mouth daily with breakfast. 08/20/15  Yes Massie MaroonHollis, Lachina M, FNP  SYNTHROID 125 MCG tablet Take 125 mcg by mouth every morning. 12/23/18  Yes [provider]  levothyroxine (SYNTHROID, LEVOTHROID) 150 MCG tablet Take 1 tablet (150 mcg total) by mouth every evening. Patient not taking: Reported on 01/31/2019 07/16/16   Albertine PatriciaBernhardt,  Jaclyn ShaggyLinda C, NP    Physical Exam: Vitals:   01/31/19 2030 02/01/19 0017 02/01/19 0023 02/01/19 0025  BP: 120/79 102/72    Pulse: (!) 101 98    Resp:  (!) 21    Temp:    99.3 F (37.4 C)  TempSrc:    Oral  SpO2: 95% 94% 95%   Weight:      Height:        Constitutional: Looks  acutely ill. Eyes: PERRL, lids and conjunctivae normal ENMT: Mucous membranes are moist. Posterior pharynx clear of any exudate or lesions.  Neck: normal, supple, no masses, no thyromegaly Respiratory: Tachypneic in the mid 20s with mild wheezing and bibasilar crackles.  No accessory muscle use.  Cardiovascular: Regular rate and rhythm, no murmurs / rubs / gallops. No extremity edema. 2+ pedal pulses. No carotid bruits.  Abdomen: Obese. Bowel sounds positive.  Soft, no tenderness, no masses palpated. No hepatosplenomegaly.  Musculoskeletal: no clubbing / cyanosis. Good ROM, no contractures. Normal muscle tone.  Skin: no rashes, lesions, ulcers. No induration Neurologic: CN 2-12 grossly intact. Sensation intact, DTR normal. Strength 5/5 in all 4.  Psychiatric: Normal judgment and insight. Alert and oriented x 3. Normal mood.   Labs on Admission: I have personally reviewed following labs and imaging studies  CBC: Recent Labs  Lab 01/31/19 2338  WBC 5.1  NEUTROABS 3.5  HGB 13.6  HCT 42.3  MCV 80.6  PLT 207   Basic Metabolic Panel: Recent Labs  Lab 01/31/19 2338 02/01/19 0020  NA 135  --   K 3.5  --   CL 100  --   CO2 24  --   GLUCOSE 108*  --   BUN 15  --   CREATININE 0.92  --   CALCIUM 8.7*  --   MG  --  1.7  PHOS  --  3.5   GFR: Estimated Creatinine Clearance: 75.1 mL/min (by C-G formula based on SCr of 0.92 mg/dL). Liver Function Tests: Recent Labs  Lab 01/31/19 2338  AST 30  ALT 27  ALKPHOS 87  BILITOT 0.5  PROT 8.1  ALBUMIN 3.5   No results for input(s): LIPASE, AMYLASE in the last 168 hours. No results for input(s): AMMONIA in the last 168 hours. Coagulation Profile: No results for input(s): INR, PROTIME in the last 168 hours. Cardiac Enzymes: No results for input(s): CKTOTAL, CKMB, CKMBINDEX, TROPONINI in the last 168 hours. BNP (last 3 results) No results for input(s): PROBNP in the last 8760 hours. HbA1C: No results for input(s): HGBA1C in the last  72 hours. CBG: No results for input(s): GLUCAP in the last 168 hours. Lipid Profile: Recent Labs    01/31/19 2338  TRIG 115   Thyroid Function Tests: No results for input(s): TSH, T4TOTAL, FREET4, T3FREE, THYROIDAB in the last 72 hours. Anemia Panel: Recent Labs    01/31/19 2338  FERRITIN 74   Urine analysis:    Component Value Date/Time   COLORURINE YELLOW 12/01/2012 1922   APPEARANCEUR CLEAR 12/01/2012 1922   LABSPEC 1.020 12/02/2015 1446   PHURINE 7.0 12/02/2015 1446   GLUCOSEU NEGATIVE 12/02/2015 1446   HGBUR SMALL (A) 12/02/2015 1446   HGBUR trace-intact 07/15/2009 1011   BILIRUBINUR NEGATIVE 12/02/2015 1446   BILIRUBINUR NEG 02/04/2011 1027   KETONESUR NEGATIVE 12/02/2015 1446   PROTEINUR NEGATIVE 12/02/2015 1446   UROBILINOGEN 0.2 12/02/2015 1446   NITRITE NEGATIVE 12/02/2015 1446   LEUKOCYTESUR SMALL (A) 12/02/2015 1446    Radiological Exams on Admission: No  results found.  EKG: Independently reviewed. Vent. rate 93 BPM PR interval * ms QRS duration 84 ms QT/QTc 337/420 ms P-R-T axes -3 0 25 Sinus rhythm Low voltage, precordial leads Baseline wander in lead(s) II III aVF No previous ECGs available  Assessment/Plan Principal Problem:   Pneumonia due to COVID-19 virus Admit to telemetry/inpatient. Continue supplemental oxygen. Continue dexamethasone daily. Continue Zithromax (2 more doses pending). Start ceftriaxone 2 g IVPB every 24 hours. Remdesivir per pharmacy. Actemra IV x1 dose.  Active Problems:   HYPOTHYROIDISM, POST-RADIOACTIVE IODINE Continue level thyroxine 125 mcg p.o. daily. Check TSH level.    Essential hypertension, benign Hold hydrochlorothiazide. Continue lisinopril 5 mg p.o. daily. Monitor blood pressure, renal function and electrolytes.    Type 2 diabetes mellitus (HCC) Carbohydrate modified diet.   DVT prophylaxis: Lovenox SQ. Code Status: Full code Family Communication: Disposition Plan: Admit for COVID-19  treatment. Consults called: Admission status: Inpatient/telemetry.   Reubin Milan MD [facility]  If 7PM-7AM, please contact night-coverage [email or web address]  02/01/2019, 6:23 AM   This document was prepared using Dragon voice recognition software and may contain some unintended transcription errors.

## 2019-02-01 NOTE — Progress Notes (Signed)
Patient arrived during down time and oriented to unit and [place] policies

## 2019-02-02 DIAGNOSIS — J9601 Acute respiratory failure with hypoxia: Secondary | ICD-10-CM

## 2019-02-02 DIAGNOSIS — I10 Essential (primary) hypertension: Secondary | ICD-10-CM

## 2019-02-02 DIAGNOSIS — E1169 Type 2 diabetes mellitus with other specified complication: Secondary | ICD-10-CM

## 2019-02-02 LAB — FERRITIN: Ferritin: 88 ng/mL (ref 11–307)

## 2019-02-02 LAB — COMPREHENSIVE METABOLIC PANEL
ALT: 25 U/L (ref 0–44)
AST: 25 U/L (ref 15–41)
Albumin: 3 g/dL — ABNORMAL LOW (ref 3.5–5.0)
Alkaline Phosphatase: 74 U/L (ref 38–126)
Anion gap: 10 (ref 5–15)
BUN: 22 mg/dL — ABNORMAL HIGH (ref 6–20)
CO2: 22 mmol/L (ref 22–32)
Calcium: 8.7 mg/dL — ABNORMAL LOW (ref 8.9–10.3)
Chloride: 107 mmol/L (ref 98–111)
Creatinine, Ser: 0.79 mg/dL (ref 0.44–1.00)
GFR calc Af Amer: >60 mL/min (ref >60)
GFR calc non Af Amer: >60 mL/min (ref >60)
Glucose, Bld: 155 mg/dL — ABNORMAL HIGH (ref 70–99)
Potassium: 4.1 mmol/L (ref 3.5–5.1)
Sodium: 139 mmol/L (ref 135–145)
Total Bilirubin: 0.2 mg/dL — ABNORMAL LOW (ref 0.3–1.2)
Total Protein: 7.4 g/dL (ref 6.5–8.1)

## 2019-02-02 LAB — CBC WITH DIFFERENTIAL/PLATELET
Abs Immature Granulocytes: 0.01 10*3/uL (ref 0.00–0.07)
Basophils Absolute: 0 10*3/uL (ref 0.0–0.1)
Basophils Relative: 0 %
Eosinophils Absolute: 0 10*3/uL (ref 0.0–0.5)
Eosinophils Relative: 0 %
HCT: 38.8 % (ref 36.0–46.0)
Hemoglobin: 12.4 g/dL (ref 12.0–15.0)
Immature Granulocytes: 0 %
Lymphocytes Relative: 33 %
Lymphs Abs: 0.9 10*3/uL (ref 0.7–4.0)
MCH: 25.8 pg — ABNORMAL LOW (ref 26.0–34.0)
MCHC: 32 g/dL (ref 30.0–36.0)
MCV: 80.8 fL (ref 80.0–100.0)
Monocytes Absolute: 0.1 10*3/uL (ref 0.1–1.0)
Monocytes Relative: 5 %
Neutro Abs: 1.6 10*3/uL — ABNORMAL LOW (ref 1.7–7.7)
Neutrophils Relative %: 62 %
Platelets: 222 10*3/uL (ref 150–400)
RBC: 4.8 MIL/uL (ref 3.87–5.11)
RDW: 14.7 % (ref 11.5–15.5)
WBC: 2.7 10*3/uL — ABNORMAL LOW (ref 4.0–10.5)
nRBC: 0 % (ref 0.0–0.2)

## 2019-02-02 LAB — GLUCOSE, CAPILLARY
Glucose-Capillary: 133 mg/dL — ABNORMAL HIGH (ref 70–99)
Glucose-Capillary: 176 mg/dL — ABNORMAL HIGH (ref 70–99)
Glucose-Capillary: 130 mg/dL — ABNORMAL HIGH (ref 70–99)
Glucose-Capillary: 147 mg/dL — ABNORMAL HIGH (ref 70–99)

## 2019-02-02 LAB — HIV ANTIBODY (ROUTINE TESTING W REFLEX): HIV Screen 4th Generation wRfx: NONREACTIVE

## 2019-02-02 LAB — HEMOGLOBIN A1C
Hgb A1c MFr Bld: 6.5 % — ABNORMAL HIGH (ref 4.8–5.6)
Mean Plasma Glucose: 139.85 mg/dL

## 2019-02-02 LAB — D-DIMER, QUANTITATIVE: D-Dimer, Quant: 0.44 ug/mL-FEU (ref 0.00–0.50)

## 2019-02-02 LAB — HEPATITIS B SURFACE ANTIGEN: Hepatitis B Surface Ag: NEGATIVE

## 2019-02-02 LAB — C-REACTIVE PROTEIN: CRP: 8.9 mg/dL — ABNORMAL HIGH (ref <1.0)

## 2019-02-02 NOTE — Progress Notes (Addendum)
PROGRESS NOTE                                                                                                                                                                                                             Patient Demographics:    Judith Kelly, is a 48 y.o. female, DOB - 20-May-1971, ZOX:[id number]  Outpatient Primary MD for the patient is Massie MaroonHollis, Lachina M, FNP   Admit date - 01/31/2019   LOS - 1  Chief Complaint  Patient presents with  . Shortness of Breath       Brief Narrative: Patient is a 48 y.o. female with PMHx of HTN, hypothyroidism, DM-2-presented with several days history of worsening cough, shortness of breath-her husband recently had COVID-19-she was subsequently found to have acute hypoxic respiratory failure secondary to COVID-19 pneumonia.   Subjective:    Judith Haileyralia Guajardo Kelly today feels better-remains on just 1-2 L of oxygen.   Assessment  & Plan :   Acute Hypoxic Resp Failure due to Covid 19 Viral pneumonia: Stable on just 1-2 L of oxygen-denies any shortness of breath-continue steroids and Remdesivir.   If hypoxemia worsens-we will dose her with Actemra.  Note: Rationale, risks, benefits of Actemra discussed in great detail.  She consents to the use of Actemra if needed (no history of tuberculosis, HBsAg negative)  Fever: Afebrile  O2 requirements: On 2 l/m (2 L yesterday)  COVID-19 Labs: Recent Labs    01/31/19 2338 02/02/19 0354  DDIMER 0.55* 0.44  FERRITIN 74 88  LDH 243*  --   CRP 9.0* 8.9*    COVID-19 Medications: Steroids: Decadron 8/19 x 1, Solu-Medrol 8/20>> Remdesivir: 8/20>> Actemra: See above Convalescent Plasma:N/A Research Studies:N/A  Other medications: Diuretics: Euvolemic-no indication for Lasix. Antibiotics:Not needed as no evidence of bacterial infection (procalcitonin within normal range)  Prone/Incentive Spirometry: encouraged patient to  lie prone for 3-4 hours at a time for a total of 16 hours a day, and to encourage incentive spirometry use 3-4/hour.  DVT Prophylaxis  :  Lovenox   DM-2: CBG stable on SSI.  Continue to hold oral hypoglycemic agents.  HTN: Controlled-continue lisinopril  Hypothyroidism: Continue with Synthroid  Obesity: Estimated body mass index is 38.92 kg/m as calculated from the following:   Height as of this encounter: 5' (1.524 m).   Weight as of this  encounter: 90.4 kg.   ABG: No results found for: PHART, PCO2ART, PO2ART, HCO3, TCO2, ACIDBASEDEF, O2SAT  Vent Settings: N/A  Condition - Stable  Family Communication: Left voicemail for family-at number listed in facesheet on 8/21  Code Status :  Full Code  Diet :  Diet Order            Diet heart healthy/carb modified Room service appropriate? Yes; Fluid consistency: Thin  Diet effective now               Disposition Plan  :  Remain hospitalized-needs 5 days of inpatient therapy with remdesivir  Consults  :  None  Procedures  :  None  Antibiotics  :    Anti-infectives (From admission, onward)   Start     Dose/Rate Route Frequency Ordered Stop   02/02/19 0800  remdesivir 100 mg in sodium chloride 0.9 % 250 mL IVPB     100 mg 500 mL/hr over 30 Minutes Intravenous Every 24 hours 02/01/19 0646 02/06/19 0759   02/01/19 1000  azithromycin (ZITHROMAX) tablet 250 mg  Status:  Discontinued     250 mg Oral Daily 02/01/19 0553 02/01/19 0703   02/01/19 0800  remdesivir 200 mg in sodium chloride 0.9 % 250 mL IVPB     200 mg 500 mL/hr over 30 Minutes Intravenous Once 02/01/19 0646 02/01/19 1041   02/01/19 0600  cefTRIAXone (ROCEPHIN) 2 g in sodium chloride 0.9 % 100 mL IVPB  Status:  Discontinued     2 g 200 mL/hr over 30 Minutes Intravenous Every 24 hours 02/01/19 0515 02/01/19 0703      Inpatient Medications  Scheduled Meds: . enoxaparin (LOVENOX) injection  40 mg Subcutaneous Q24H  . insulin aspart  0-20 Units Subcutaneous TID  WC  . levothyroxine  125 mcg Oral Daily  . lisinopril  5 mg Oral Daily  . loratadine  10 mg Oral Daily  . methylPREDNISolone (SOLU-MEDROL) injection  40 mg Intravenous Q12H   Continuous Infusions: . remdesivir 100 mg in NS 250 mL 100 mg (02/02/19 0837)   PRN Meds:.chlorpheniramine-HYDROcodone, guaiFENesin-dextromethorphan   Time Spent in minutes  25  See all Orders from today for further details   Jeoffrey MassedShanker  M.D on 02/02/2019 at 10:42 AM  To page go to [email or web address] - use universal password  [facility] -  Office  [phone]    Objective:   Vitals:   02/01/19 2150 02/02/19 0450 02/02/19 0818 02/02/19 0937  BP: 109/67 100/68  111/81  Pulse: 85 72    Resp:  15    Temp: 97.9 F (36.6 C) 97.8 F (36.6 C) 98.2 F (36.8 C)   TempSrc: Oral Oral Tympanic   SpO2: 92% 91% 92%   Weight:      Height:        Wt Readings from Last 3 Encounters:  02/01/19 90.4 kg  12/23/15 91.6 kg  12/02/15 90.7 kg     Intake/Output Summary (Last 24 hours) at 02/02/2019 1042 Last data filed at 02/01/2019 1800 Gross per 24 hour  Intake 250 ml  Output -  Net 250 ml     Physical Exam Gen Exam:Alert awake-not in any distress HEENT:atraumatic, normocephalic Chest: B/L clear to auscultation anteriorly CVS:S1S2 regular Abdomen:soft non tender, non distended Extremities:no edema Neurology: Non focal Skin: no rash   Data Review:    CBC Recent Labs  Lab 01/31/19 2338 02/01/19 0020 02/02/19 0354  WBC 5.1 5.2 2.7*  HGB 13.6 14.2 12.4  HCT 42.3 45.1 38.8  PLT 207 214 222  MCV 80.6 82.9 80.8  MCH 25.9* 26.1 25.8*  MCHC 32.2 31.5 32.0  RDW 14.9 15.5 14.7  LYMPHSABS 1.4  --  0.9  MONOABS 0.2  --  0.1  EOSABS 0.0  --  0.0  BASOSABS 0.0  --  0.0    Chemistries  Recent Labs  Lab 01/31/19 2338 02/01/19 0020 02/02/19 0354  NA 135  --  139  K 3.5  --  4.1  CL 100  --  107  CO2 24  --  22  GLUCOSE 108*  --  155*  BUN 15  --  22*  CREATININE 0.92 0.92 0.79   CALCIUM 8.7*  --  8.7*  MG  --  1.7  --   AST 30  --  25  ALT 27  --  25  ALKPHOS 87  --  74  BILITOT 0.5  --  0.2*   ------------------------------------------------------------------------------------------------------------------ Recent Labs    01/31/19 2338  TRIG 115    Lab Results  Component Value Date   HGBA1C 6.5 (H) 02/02/2019   ------------------------------------------------------------------------------------------------------------------ No results for input(s): TSH, T4TOTAL, T3FREE, THYROIDAB in the last 72 hours.  Invalid input(s): FREET3 ------------------------------------------------------------------------------------------------------------------ Recent Labs    01/31/19 2338 02/02/19 0354  FERRITIN 74 88    Coagulation profile No results for input(s): INR, PROTIME in the last 168 hours.  Recent Labs    01/31/19 2338 02/02/19 0354  DDIMER 0.55* 0.44    Cardiac Enzymes No results for input(s): CKMB, TROPONINI, MYOGLOBIN in the last 168 hours.  Invalid input(s): CK ------------------------------------------------------------------------------------------------------------------    Component Value Date/Time   BNP 9.3 01/31/2019 2338    Micro Results Recent Results (from the past 240 hour(s))  SARS Coronavirus 2 [facility] order, Performed in [facility] hospital lab) Nasopharyngeal Nasopharyngeal Swab     Status: Abnormal   Collection Time: 01/31/19  8:30 PM   Specimen: Nasopharyngeal Swab  Result Value Ref Range Status   SARS Coronavirus 2 POSITIVE (A) NEGATIVE Final    Comment: CRITICAL RESULT CALLED TO, READ BACK BY AND VERIFIED WITH: RN Sharene Skeans AT 2142 01/31/19 CRUICKSHANK A (NOTE) If result is NEGATIVE SARS-CoV-2 target nucleic acids are NOT DETECTED. The SARS-CoV-2 RNA is generally detectable in upper and lower  respiratory specimens during the acute phase of infection. The lowest  concentration of SARS-CoV-2 viral copies this assay  can detect is 250  copies / mL. A negative result does not preclude SARS-CoV-2 infection  and should not be used as the sole basis for treatment or other  patient management decisions.  A negative result may occur with  improper specimen collection / handling, submission of specimen other  than nasopharyngeal swab, presence of viral mutation(s) within the  areas targeted by this assay, and inadequate number of viral copies  (<250 copies / mL). A negative result must be combined with clinical  observations, patient history, and epidemiological information. If result is POSITIVE SARS-CoV-2 target nucleic acids ar e DETECTED. The SARS-CoV-2 RNA is generally detectable in upper and lower  respiratory specimens during the acute phase of infection.  Positive  results are indicative of active infection with SARS-CoV-2.  Clinical  correlation with patient history and other diagnostic information is  necessary to determine patient infection status.  Positive results do  not rule out bacterial infection or co-infection with other viruses. If result is PRESUMPTIVE POSTIVE SARS-CoV-2 nucleic acids MAY BE PRESENT.   A presumptive positive result was obtained on the submitted  specimen  and confirmed on repeat testing.  While 2019 novel coronavirus  (SARS-CoV-2) nucleic acids may be present in the submitted sample  additional confirmatory testing may be necessary for epidemiological  and / or clinical management purposes  to differentiate between  SARS-CoV-2 and other Sarbecovirus currently known to infect humans.  If clinically indicated additional testing with an alternate test  methodolog y [id number]) is advised. The SARS-CoV-2 RNA is generally  detectable in upper and lower respiratory specimens during the acute  phase of infection. The expected result is Negative. Fact Sheet for Patients:  BoilerBrush.com.cyhttps://www.fda.gov/media/136312/download Fact Sheet for Healthcare Providers:  https://pope.com/https://www.fda.gov/media/136313/download This test is not yet approved or cleared by the [place] FDA and has been authorized for detection and/or diagnosis of SARS-CoV-2 by FDA under an Emergency Use Authorization (EUA).  This EUA will remain in effect (meaning this test can be used) for the duration of the COVID-19 declaration under Section 564(b)(1) of the Act, 21 U.S.C. section 360bbb-3(b)(1), unless the authorization is terminated or revoked sooner. Performed at [facility], [address] [place], [place] [address]   Blood Culture (routine x 2)     Status: None (Preliminary result)   Collection Time: 01/31/19 11:38 PM   Specimen: BLOOD  Result Value Ref Range Status   Specimen Description   Final    BLOOD RIGHT ANTECUBITAL Performed at [facility], [address] [place], [place] [address]    Special Requests   Final    BOTTLES DRAWN AEROBIC AND ANAEROBIC Blood Culture adequate volume Performed at [facility], [address] [place], [place] [address]    Culture   Final    NO GROWTH 1 DAY Performed at [facility], [address] [place], [place] [address]    Report Status PENDING  Incomplete  Blood Culture (routine x 2)     Status: None (Preliminary result)   Collection Time: 01/31/19 11:43 PM   Specimen: BLOOD LEFT FOREARM  Result Value Ref Range Status   Specimen Description   Final    BLOOD LEFT FOREARM Performed at [facility], [address] [place], [place] [address]    Special Requests   Final    BOTTLES DRAWN AEROBIC ONLY Blood Culture adequate volume Performed at [facility], [address] [place], [place] [address]    Culture   Final    NO GROWTH 1 DAY Performed at [facility], [address] [place], [place] [address]    Report Status PENDING  Incomplete    Radiology Reports Dg Chest Port 1 View  Result Date: 02/01/2019 CLINICAL DATA:   Dyspnea. Weakness, shortness of breath, body aches, and fever. EXAM: PORTABLE CHEST 1 VIEW COMPARISON:  08/29/2009 FINDINGS: Mild cardiomegaly. Vascular congestion. Mild pulmonary edema. No confluent airspace disease, large pleural effusion or pneumothorax. No acute osseous abnormalities are seen. IMPRESSION: Mild cardiomegaly with vascular congestion and mild pulmonary edema. Electronically Signed   By: Narda RutherfordMelanie  Sanford M.D.   On: 02/01/2019 19:30

## 2019-02-03 LAB — CBC WITH DIFFERENTIAL/PLATELET
Abs Immature Granulocytes: 0.06 10*3/uL (ref 0.00–0.07)
Basophils Absolute: 0 10*3/uL (ref 0.0–0.1)
Basophils Relative: 0 %
Eosinophils Absolute: 0 10*3/uL (ref 0.0–0.5)
Eosinophils Relative: 0 %
HCT: 40.1 % (ref 36.0–46.0)
Hemoglobin: 12.9 g/dL (ref 12.0–15.0)
Immature Granulocytes: 1 %
Lymphocytes Relative: 21 %
Lymphs Abs: 1.2 10*3/uL (ref 0.7–4.0)
MCH: 26.1 pg (ref 26.0–34.0)
MCHC: 32.2 g/dL (ref 30.0–36.0)
MCV: 81 fL (ref 80.0–100.0)
Monocytes Absolute: 0.3 10*3/uL (ref 0.1–1.0)
Monocytes Relative: 5 %
Neutro Abs: 4.1 10*3/uL (ref 1.7–7.7)
Neutrophils Relative %: 73 %
Platelets: 283 10*3/uL (ref 150–400)
RBC: 4.95 MIL/uL (ref 3.87–5.11)
RDW: 14.8 % (ref 11.5–15.5)
WBC: 5.7 10*3/uL (ref 4.0–10.5)
nRBC: 0 % (ref 0.0–0.2)

## 2019-02-03 LAB — COMPREHENSIVE METABOLIC PANEL
ALT: 27 U/L (ref 0–44)
AST: 24 U/L (ref 15–41)
Albumin: 3 g/dL — ABNORMAL LOW (ref 3.5–5.0)
Alkaline Phosphatase: 87 U/L (ref 38–126)
Anion gap: 8 (ref 5–15)
BUN: 23 mg/dL — ABNORMAL HIGH (ref 6–20)
CO2: 23 mmol/L (ref 22–32)
Calcium: 8.9 mg/dL (ref 8.9–10.3)
Chloride: 109 mmol/L (ref 98–111)
Creatinine, Ser: 0.68 mg/dL (ref 0.44–1.00)
GFR calc Af Amer: >60 mL/min (ref >60)
GFR calc non Af Amer: >60 mL/min (ref >60)
Glucose, Bld: 145 mg/dL — ABNORMAL HIGH (ref 70–99)
Potassium: 4.5 mmol/L (ref 3.5–5.1)
Sodium: 140 mmol/L (ref 135–145)
Total Bilirubin: 0.3 mg/dL (ref 0.3–1.2)
Total Protein: 7.3 g/dL (ref 6.5–8.1)

## 2019-02-03 LAB — C-REACTIVE PROTEIN: CRP: 3.2 mg/dL — ABNORMAL HIGH (ref <1.0)

## 2019-02-03 LAB — GLUCOSE, CAPILLARY
Glucose-Capillary: 123 mg/dL — ABNORMAL HIGH (ref 70–99)
Glucose-Capillary: 137 mg/dL — ABNORMAL HIGH (ref 70–99)
Glucose-Capillary: 160 mg/dL — ABNORMAL HIGH (ref 70–99)
Glucose-Capillary: 185 mg/dL — ABNORMAL HIGH (ref 70–99)

## 2019-02-03 LAB — D-DIMER, QUANTITATIVE: D-Dimer, Quant: 0.43 ug/mL-FEU (ref 0.00–0.50)

## 2019-02-03 LAB — FERRITIN: Ferritin: 81 ng/mL (ref 11–307)

## 2019-02-03 NOTE — Progress Notes (Signed)
PROGRESS NOTE                                                                                                                                                                                                             Patient Demographics:    Judith Kelly, is a 48 y.o. female, DOB - 02-18-1971, ZOX:[id number]  Outpatient Primary MD for the patient is Massie MaroonHollis, Lachina M, FNP   Admit date - 01/31/2019   LOS - 2  Chief Complaint  Patient presents with  . Shortness of Breath       Brief Narrative: Patient is a 48 y.o. female with PMHx of HTN, hypothyroidism, DM-2-presented with several days history of worsening cough, shortness of breath-her husband recently had COVID-19-she was subsequently found to have acute hypoxic respiratory failure secondary to COVID-19 pneumonia.   Subjective:    Judith Kelly feels better-cough continues but it seems to be less.  No shortness of breath.   Assessment  & Plan :   Acute Hypoxic Resp Failure due to Covid 19 Viral pneumonia: Slowly improving-remains on minimal amount of oxygen support.  Continue steroids and Remdesivir.  No indication to dose Actemra as hypoxemia seems to be minimal.   h  Note: Rationale, risks, benefits of Actemra discussed in great detail.  She consents to the use of Actemra if needed (no history of tuberculosis, HBsAg negative)  Fever: Afebrile  O2 requirements: On 2 l/m (2 L yesterday)  COVID-19 Labs: Recent Labs    01/31/19 2338 02/02/19 0354 02/03/19 0302  DDIMER 0.55* 0.44 0.43  FERRITIN 74 88 81  LDH 243*  --   --   CRP 9.0* 8.9* 3.2*    COVID-19 Medications: Steroids: Decadron 8/19 x 1, Solu-Medrol 8/20>> Remdesivir: 8/20>> Actemra: See above Convalescent Plasma:N/A Research Studies:N/A  Other medications: Diuretics: Euvolemic-no indication for Lasix. Antibiotics:Not needed as no evidence of bacterial infection (procalcitonin  within normal range)  Prone/Incentive Spirometry: encouraged patient to lie prone for 3-4 hours at a time for a total of 16 hours a day, and to encourage incentive spirometry use 3-4/hour.  DVT Prophylaxis  :  Lovenox   DM-2: CBG stable on SSI.  Continue to hold oral hypoglycemic agents.  HTN: Controlled-continue lisinopril  Hypothyroidism: Continue with Synthroid  Obesity: Estimated body mass index is 38.92 kg/m as calculated from the  following:   Height as of this encounter: 5' (1.524 m).   Weight as of this encounter: 90.4 kg.   ABG: No results found for: PHART, PCO2ART, PO2ART, HCO3, TCO2, ACIDBASEDEF, O2SAT  Vent Settings: N/A  Condition - Stable  Family Communication: Spoke with family friend on 8/21 after patient gave me permission-we will update friend tomorrow as patient's medical issues are stable   Code Status :  Full Code  Diet :  Diet Order            Diet heart healthy/carb modified Room service appropriate? Yes; Fluid consistency: Thin  Diet effective now               Disposition Plan  :  Remain hospitalized-needs 5 days of inpatient therapy with remdesivir  Consults  :  None  Procedures  :  None  Antibiotics  :    Anti-infectives (From admission, onward)   Start     Dose/Rate Route Frequency Ordered Stop   02/02/19 0800  remdesivir 100 mg in sodium chloride 0.9 % 250 mL IVPB     100 mg 500 mL/hr over 30 Minutes Intravenous Every 24 hours 02/01/19 0646 02/06/19 0759   02/01/19 1000  azithromycin (ZITHROMAX) tablet 250 mg  Status:  Discontinued     250 mg Oral Daily 02/01/19 0553 02/01/19 0703   02/01/19 0800  remdesivir 200 mg in sodium chloride 0.9 % 250 mL IVPB     200 mg 500 mL/hr over 30 Minutes Intravenous Once 02/01/19 0646 02/01/19 1041   02/01/19 0600  cefTRIAXone (ROCEPHIN) 2 g in sodium chloride 0.9 % 100 mL IVPB  Status:  Discontinued     2 g 200 mL/hr over 30 Minutes Intravenous Every 24 hours 02/01/19 0515 02/01/19 0703       Inpatient Medications  Scheduled Meds: . enoxaparin (LOVENOX) injection  40 mg Subcutaneous Q24H  . insulin aspart  0-20 Units Subcutaneous TID WC  . levothyroxine  125 mcg Oral Daily  . lisinopril  5 mg Oral Daily  . loratadine  10 mg Oral Daily  . methylPREDNISolone (SOLU-MEDROL) injection  40 mg Intravenous Q12H   Continuous Infusions: . remdesivir 100 mg in NS 250 mL 100 mg (02/03/19 0816)   PRN Meds:.chlorpheniramine-HYDROcodone, guaiFENesin-dextromethorphan   Time Spent in minutes  25  See all Orders from today for further details   Jeoffrey MassedShanker Ghimire M.D on 02/03/2019 at 10:31 AM  To page go to [email or web address] - use universal password  [facility] -  Office  [phone]    Objective:   Vitals:   02/02/19 1738 02/02/19 2110 02/03/19 0657 02/03/19 0737  BP:  113/71 110/77 112/78  Pulse: 79 79 66 67  Resp: (!) 27 (!) 21 (!) 23 (!) 22  Temp: 98.7 F (37.1 C) 97.9 F (36.6 C) 98.9 F (37.2 C) 97.6 F (36.4 C)  TempSrc: Oral Oral Oral Oral  SpO2: 93% 94% 93% 92%  Weight:      Height:        Wt Readings from Last 3 Encounters:  02/01/19 90.4 kg  12/23/15 91.6 kg  12/02/15 90.7 kg     Intake/Output Summary (Last 24 hours) at 02/03/2019 1030 Last data filed at 02/03/2019 0200 Gross per 24 hour  Intake 240 ml  Output 300 ml  Net -60 ml     Physical Exam Gen Exam:Alert awake-not in any distress HEENT:atraumatic, normocephalic Chest: B/L clear to auscultation anteriorly CVS:S1S2 regular Abdomen:soft non tender, non distended Extremities:no edema Neurology: Non  focal Skin: no rash   Data Review:    CBC Recent Labs  Lab 01/31/19 2338 02/01/19 0020 02/02/19 0354 02/03/19 0302  WBC 5.1 5.2 2.7* 5.7  HGB 13.6 14.2 12.4 12.9  HCT 42.3 45.1 38.8 40.1  PLT 207 214 222 283  MCV 80.6 82.9 80.8 81.0  MCH 25.9* 26.1 25.8* 26.1  MCHC 32.2 31.5 32.0 32.2  RDW 14.9 15.5 14.7 14.8  LYMPHSABS 1.4  --  0.9 1.2  MONOABS 0.2  --  0.1 0.3  EOSABS 0.0   --  0.0 0.0  BASOSABS 0.0  --  0.0 0.0    Chemistries  Recent Labs  Lab 01/31/19 2338 02/01/19 0020 02/02/19 0354 02/03/19 0302  NA 135  --  139 140  K 3.5  --  4.1 4.5  CL 100  --  107 109  CO2 24  --  22 23  GLUCOSE 108*  --  155* 145*  BUN 15  --  22* 23*  CREATININE 0.92 0.92 0.79 0.68  CALCIUM 8.7*  --  8.7* 8.9  MG  --  1.7  --   --   AST 30  --  25 24  ALT 27  --  25 27  ALKPHOS 87  --  74 87  BILITOT 0.5  --  0.2* 0.3   ------------------------------------------------------------------------------------------------------------------ Recent Labs    01/31/19 2338  TRIG 115    Lab Results  Component Value Date   HGBA1C 6.5 (H) 02/02/2019   ------------------------------------------------------------------------------------------------------------------ No results for input(s): TSH, T4TOTAL, T3FREE, THYROIDAB in the last 72 hours.  Invalid input(s): FREET3 ------------------------------------------------------------------------------------------------------------------ Recent Labs    02/02/19 0354 02/03/19 0302  FERRITIN 88 81    Coagulation profile No results for input(s): INR, PROTIME in the last 168 hours.  Recent Labs    02/02/19 0354 02/03/19 0302  DDIMER 0.44 0.43    Cardiac Enzymes No results for input(s): CKMB, TROPONINI, MYOGLOBIN in the last 168 hours.  Invalid input(s): CK ------------------------------------------------------------------------------------------------------------------    Component Value Date/Time   BNP 9.3 01/31/2019 2338    Micro Results Recent Results (from the past 240 hour(s))  SARS Coronavirus 2 [facility] order, Performed in [facility] hospital lab) Nasopharyngeal Nasopharyngeal Swab     Status: Abnormal   Collection Time: 01/31/19  8:30 PM   Specimen: Nasopharyngeal Swab  Result Value Ref Range Status   SARS Coronavirus 2 POSITIVE (A) NEGATIVE Final    Comment: CRITICAL RESULT CALLED TO, READ BACK BY  AND VERIFIED WITH: RN Sharene Skeans AT 2142 01/31/19 CRUICKSHANK A (NOTE) If result is NEGATIVE SARS-CoV-2 target nucleic acids are NOT DETECTED. The SARS-CoV-2 RNA is generally detectable in upper and lower  respiratory specimens during the acute phase of infection. The lowest  concentration of SARS-CoV-2 viral copies this assay can detect is 250  copies / mL. A negative result does not preclude SARS-CoV-2 infection  and should not be used as the sole basis for treatment or other  patient management decisions.  A negative result may occur with  improper specimen collection / handling, submission of specimen other  than nasopharyngeal swab, presence of viral mutation(s) within the  areas targeted by this assay, and inadequate number of viral copies  (<250 copies / mL). A negative result must be combined with clinical  observations, patient history, and epidemiological information. If result is POSITIVE SARS-CoV-2 target nucleic acids ar e DETECTED. The SARS-CoV-2 RNA is generally detectable in upper and lower  respiratory specimens during the acute phase  of infection.  Positive  results are indicative of active infection with SARS-CoV-2.  Clinical  correlation with patient history and other diagnostic information is  necessary to determine patient infection status.  Positive results do  not rule out bacterial infection or co-infection with other viruses. If result is PRESUMPTIVE POSTIVE SARS-CoV-2 nucleic acids MAY BE PRESENT.   A presumptive positive result was obtained on the submitted specimen  and confirmed on repeat testing.  While 2019 novel coronavirus  (SARS-CoV-2) nucleic acids may be present in the submitted sample  additional confirmatory testing may be necessary for epidemiological  and / or clinical management purposes  to differentiate between  SARS-CoV-2 and other Sarbecovirus currently known to infect humans.  If clinically indicated additional testing with an alternate test   methodolog y [phone] is advised. The SARS-CoV-2 RNA is generally  detectable in upper and lower respiratory specimens during the acute  phase of infection. The expected result is Negative. Fact Sheet for Patients:  BoilerBrush.com.cyhttps://www.fda.gov/media/136312/download Fact Sheet for Healthcare Providers: https://pope.com/https://www.fda.gov/media/136313/download This test is not yet approved or cleared by the [place] FDA and has been authorized for detection and/or diagnosis of SARS-CoV-2 by FDA under an Emergency Use Authorization (EUA).  This EUA will remain in effect (meaning this test can be used) for the duration of the COVID-19 declaration under Section 564(b)(1) of the Act, 21 U.S.C. section 360bbb-3(b)(1), unless the authorization is terminated or revoked sooner. Performed at [facility], [address] [place], [place] [address]   Blood Culture (routine x 2)     Status: None (Preliminary result)   Collection Time: 01/31/19 11:38 PM   Specimen: BLOOD  Result Value Ref Range Status   Specimen Description   Final    BLOOD RIGHT ANTECUBITAL Performed at [facility], [address] [place], [place] [address]    Special Requests   Final    BOTTLES DRAWN AEROBIC AND ANAEROBIC Blood Culture adequate volume Performed at [facility], [address] [place], [place] [address]    Culture   Final    NO GROWTH 2 DAYS Performed at [facility], [address] [place], [place] [address]    Report Status PENDING  Incomplete  Blood Culture (routine x 2)     Status: None (Preliminary result)   Collection Time: 01/31/19 11:43 PM   Specimen: BLOOD LEFT FOREARM  Result Value Ref Range Status   Specimen Description   Final    BLOOD LEFT FOREARM Performed at [facility], [address] [place], [place] [address]    Special Requests   Final    BOTTLES DRAWN AEROBIC ONLY Blood Culture adequate volume Performed at [facility], [address] [place], [place] [address]    Culture   Final    NO GROWTH 2 DAYS Performed at [facility], [address] [place], [place] [address]    Report Status PENDING  Incomplete    Radiology Reports Dg Chest Port 1 View  Result Date: 02/01/2019 CLINICAL DATA:  Dyspnea. Weakness, shortness of breath, body aches, and fever. EXAM: PORTABLE CHEST 1 VIEW COMPARISON:  08/29/2009 FINDINGS: Mild cardiomegaly. Vascular congestion. Mild pulmonary edema. No confluent airspace disease, large pleural effusion or pneumothorax. No acute osseous abnormalities are seen. IMPRESSION: Mild cardiomegaly with vascular congestion and mild pulmonary edema. Electronically Signed   By: Narda RutherfordMelanie  Sanford M.D.   On: 02/01/2019 19:30

## 2019-02-04 LAB — CBC WITH DIFFERENTIAL/PLATELET
Abs Immature Granulocytes: 0.15 10*3/uL — ABNORMAL HIGH (ref 0.00–0.07)
Basophils Absolute: 0 10*3/uL (ref 0.0–0.1)
Basophils Relative: 0 %
Eosinophils Absolute: 0 10*3/uL (ref 0.0–0.5)
Eosinophils Relative: 0 %
HCT: 39.4 % (ref 36.0–46.0)
Hemoglobin: 12.7 g/dL (ref 12.0–15.0)
Immature Granulocytes: 2 %
Lymphocytes Relative: 25 %
Lymphs Abs: 1.8 10*3/uL (ref 0.7–4.0)
MCH: 25.9 pg — ABNORMAL LOW (ref 26.0–34.0)
MCHC: 32.2 g/dL (ref 30.0–36.0)
MCV: 80.4 fL (ref 80.0–100.0)
Monocytes Absolute: 0.5 10*3/uL (ref 0.1–1.0)
Monocytes Relative: 8 %
Neutro Abs: 4.6 10*3/uL (ref 1.7–7.7)
Neutrophils Relative %: 65 %
Platelets: 326 10*3/uL (ref 150–400)
RBC: 4.9 MIL/uL (ref 3.87–5.11)
RDW: 14.6 % (ref 11.5–15.5)
WBC: 7.1 10*3/uL (ref 4.0–10.5)
nRBC: 0 % (ref 0.0–0.2)

## 2019-02-04 LAB — GLUCOSE, CAPILLARY
Glucose-Capillary: 117 mg/dL — ABNORMAL HIGH (ref 70–99)
Glucose-Capillary: 146 mg/dL — ABNORMAL HIGH (ref 70–99)
Glucose-Capillary: 153 mg/dL — ABNORMAL HIGH (ref 70–99)
Glucose-Capillary: 205 mg/dL — ABNORMAL HIGH (ref 70–99)

## 2019-02-04 LAB — COMPREHENSIVE METABOLIC PANEL
ALT: 30 U/L (ref 0–44)
AST: 22 U/L (ref 15–41)
Albumin: 2.9 g/dL — ABNORMAL LOW (ref 3.5–5.0)
Alkaline Phosphatase: 79 U/L (ref 38–126)
Anion gap: 9 (ref 5–15)
BUN: 23 mg/dL — ABNORMAL HIGH (ref 6–20)
CO2: 21 mmol/L — ABNORMAL LOW (ref 22–32)
Calcium: 8.9 mg/dL (ref 8.9–10.3)
Chloride: 109 mmol/L (ref 98–111)
Creatinine, Ser: 0.72 mg/dL (ref 0.44–1.00)
GFR calc Af Amer: >60 mL/min (ref >60)
GFR calc non Af Amer: >60 mL/min (ref >60)
Glucose, Bld: 146 mg/dL — ABNORMAL HIGH (ref 70–99)
Potassium: 3.9 mmol/L (ref 3.5–5.1)
Sodium: 139 mmol/L (ref 135–145)
Total Bilirubin: 0.2 mg/dL — ABNORMAL LOW (ref 0.3–1.2)
Total Protein: 6.9 g/dL (ref 6.5–8.1)

## 2019-02-04 LAB — FERRITIN: Ferritin: 59 ng/mL (ref 11–307)

## 2019-02-04 LAB — C-REACTIVE PROTEIN: CRP: 1.5 mg/dL — ABNORMAL HIGH (ref <1.0)

## 2019-02-04 LAB — D-DIMER, QUANTITATIVE: D-Dimer, Quant: 0.43 ug/mL-FEU (ref 0.00–0.50)

## 2019-02-04 NOTE — Progress Notes (Signed)
PROGRESS NOTE                                                                                                                                                                                                             Patient Demographics:    Judith Kelly, is a 48 y.o. female, DOB - 19-May-1971, XIP:[id number]  Outpatient Primary MD for the patient is Dorena Dew, FNP   Admit date - 01/31/2019   LOS - 3  Chief Complaint  Patient presents with  . Shortness of Breath       Brief Narrative: Patient is a 48 y.o. female with PMHx of HTN, hypothyroidism, DM-2-presented with several days history of worsening cough, shortness of breath-her husband recently had COVID-60-she was subsequently found to have acute hypoxic respiratory failure secondary to COVID-19 pneumonia.   Subjective:    Judith Kelly feels much better-she is now on room air.   Assessment  & Plan :   Acute Hypoxic Resp Failure due to Covid 19 Viral pneumonia: Much improved-has been titrated to room air.  Continue steroids and Remdesivir.  Suspect that if clinical improvement continues-home on 8/24.   Fever: Afebrile  O2 requirements: On room air (2 L yesterday)  COVID-19 Labs: Recent Labs    02/02/19 0354 02/03/19 0302 02/04/19 0432  DDIMER 0.44 0.43 0.43  FERRITIN 88 81 59  CRP 8.9* 3.2* 1.5*    COVID-19 Medications: Steroids: Decadron 8/19 x 1, Solu-Medrol 8/20>> Remdesivir: 8/20>> Actemra: not given Convalescent Plasma:N/A Research Studies:N/A  Other medications: Diuretics: Euvolemic-no indication for Lasix. Antibiotics:Not needed as no evidence of bacterial infection (procalcitonin within normal range)  Prone/Incentive Spirometry: encouraged patient to lie prone for 3-4 hours at a time for a total of 16 hours a day, and to encourage incentive spirometry use 3-4/hour.  DVT Prophylaxis  :  Lovenox   DM-2: CBG stable  on SSI.  Continue to hold oral hypoglycemic agents.  CBG (last 3)  Recent Labs    02/03/19 1628 02/03/19 2054 02/04/19 0757  GLUCAP 160* 185* 117*    HTN: Controlled-continue lisinopril  Hypothyroidism: Continue with Synthroid  Obesity: Estimated body mass index is 38.92 kg/m as calculated from the following:   Height as of this encounter: 5' (1.524 m).   Weight as of this encounter: 90.4 kg.   ABG: No results  found for: PHART, PCO2ART, PO2ART, HCO3, TCO2, ACIDBASEDEF, O2SAT  Vent Settings: N/A  Condition - Stable  Family Communication: Left message for patient's friend on 8/23  Code Status :  Full Code  Diet :  Diet Order            Diet heart healthy/carb modified Room service appropriate? Yes; Fluid consistency: Thin  Diet effective now               Disposition Plan  :  Remain hospitalized-home on 8/24 if clinical improvement continues  Consults  :  None  Procedures  :  None  Antibiotics  :    Anti-infectives (From admission, onward)   Start     Dose/Rate Route Frequency Ordered Stop   02/02/19 0800  remdesivir 100 mg in sodium chloride 0.9 % 250 mL IVPB     100 mg 500 mL/hr over 30 Minutes Intravenous Every 24 hours 02/01/19 0646 02/06/19 0759   02/01/19 1000  azithromycin (ZITHROMAX) tablet 250 mg  Status:  Discontinued     250 mg Oral Daily 02/01/19 0553 02/01/19 0703   02/01/19 0800  remdesivir 200 mg in sodium chloride 0.9 % 250 mL IVPB     200 mg 500 mL/hr over 30 Minutes Intravenous Once 02/01/19 0646 02/01/19 1041   02/01/19 0600  cefTRIAXone (ROCEPHIN) 2 g in sodium chloride 0.9 % 100 mL IVPB  Status:  Discontinued     2 g 200 mL/hr over 30 Minutes Intravenous Every 24 hours 02/01/19 0515 02/01/19 0703      Inpatient Medications  Scheduled Meds: . enoxaparin (LOVENOX) injection  40 mg Subcutaneous Q24H  . insulin aspart  0-20 Units Subcutaneous TID WC  . levothyroxine  125 mcg Oral Daily  . lisinopril  5 mg Oral Daily  . loratadine   10 mg Oral Daily  . methylPREDNISolone (SOLU-MEDROL) injection  40 mg Intravenous Q12H   Continuous Infusions: . remdesivir 100 mg in NS 250 mL 100 mg (02/04/19 0801)   PRN Meds:.chlorpheniramine-HYDROcodone, guaiFENesin-dextromethorphan   Time Spent in minutes  25  See all Orders from today for further details   Jeoffrey MassedShanker Chancie Lampert M.D on 02/04/2019 at 10:36 AM  To page go to [email or web address] - use universal password  [facility] -  Office  [phone]    Objective:   Vitals:   02/04/19 0634 02/04/19 0650 02/04/19 0700 02/04/19 0758  BP:    118/83  Pulse:    (!) 57  Resp: 18 20 (!) 22 15  Temp:    97.9 F (36.6 C)  TempSrc:    Oral  SpO2: 93% 93% 95% 93%  Weight:      Height:        Wt Readings from Last 3 Encounters:  02/01/19 90.4 kg  12/23/15 91.6 kg  12/02/15 90.7 kg    No intake or output data in the 24 hours ending 02/04/19 1036   Physical Exam Gen Exam:Alert awake-not in any distress HEENT:atraumatic, normocephalic Chest: B/L clear to auscultation anteriorly CVS:S1S2 regular Abdomen:soft non tender, non distended Extremities:no edema Neurology: Non focal Skin: no rash   Data Review:    CBC Recent Labs  Lab 01/31/19 2338 02/01/19 0020 02/02/19 0354 02/03/19 0302 02/04/19 0432  WBC 5.1 5.2 2.7* 5.7 7.1  HGB 13.6 14.2 12.4 12.9 12.7  HCT 42.3 45.1 38.8 40.1 39.4  PLT 207 214 222 283 326  MCV 80.6 82.9 80.8 81.0 80.4  MCH 25.9* 26.1 25.8* 26.1 25.9*  MCHC 32.2 31.5 32.0  32.2 32.2  RDW 14.9 15.5 14.7 14.8 14.6  LYMPHSABS 1.4  --  0.9 1.2 1.8  MONOABS 0.2  --  0.1 0.3 0.5  EOSABS 0.0  --  0.0 0.0 0.0  BASOSABS 0.0  --  0.0 0.0 0.0    Chemistries  Recent Labs  Lab 01/31/19 2338 02/01/19 0020 02/02/19 0354 02/03/19 0302 02/04/19 0432  NA 135  --  139 140 139  K 3.5  --  4.1 4.5 3.9  CL 100  --  107 109 109  CO2 24  --  22 23 21*  GLUCOSE 108*  --  155* 145* 146*  BUN 15  --  22* 23* 23*  CREATININE 0.92 0.92 0.79 0.68 0.72   CALCIUM 8.7*  --  8.7* 8.9 8.9  MG  --  1.7  --   --   --   AST 30  --  25 24 22   ALT 27  --  25 27 30   ALKPHOS 87  --  74 87 79  BILITOT 0.5  --  0.2* 0.3 0.2*   ------------------------------------------------------------------------------------------------------------------ No results for input(s): CHOL, HDL, LDLCALC, TRIG, CHOLHDL, LDLDIRECT in the last 72 hours.  Lab Results  Component Value Date   HGBA1C 6.5 (H) 02/02/2019   ------------------------------------------------------------------------------------------------------------------ No results for input(s): TSH, T4TOTAL, T3FREE, THYROIDAB in the last 72 hours.  Invalid input(s): FREET3 ------------------------------------------------------------------------------------------------------------------ Recent Labs    02/03/19 0302 02/04/19 0432  FERRITIN 81 59    Coagulation profile No results for input(s): INR, PROTIME in the last 168 hours.  Recent Labs    02/03/19 0302 02/04/19 0432  DDIMER 0.43 0.43    Cardiac Enzymes No results for input(s): CKMB, TROPONINI, MYOGLOBIN in the last 168 hours.  Invalid input(s): CK ------------------------------------------------------------------------------------------------------------------    Component Value Date/Time   BNP 9.3 01/31/2019 2338    Micro Results Recent Results (from the past 240 hour(s))  SARS Coronavirus 2 [facility](Hospital order, Performed in [facility] hospital lab) Nasopharyngeal Nasopharyngeal Swab     Status: Abnormal   Collection Time: 01/31/19  8:30 PM   Specimen: Nasopharyngeal Swab  Result Value Ref Range Status   SARS Coronavirus 2 POSITIVE (A) NEGATIVE Final    Comment: CRITICAL RESULT CALLED TO, READ BACK BY AND VERIFIED WITH: RN Fenton FoyC INMAN AT 2142 01/31/19 CRUICKSHANK A (NOTE) If result is NEGATIVE SARS-CoV-2 target nucleic acids are NOT DETECTED. The SARS-CoV-2 RNA is generally detectable in upper and lower  respiratory specimens during the  acute phase of infection. The lowest  concentration of SARS-CoV-2 viral copies this assay can detect is 250  copies / mL. A negative result does not preclude SARS-CoV-2 infection  and should not be used as the sole basis for treatment or other  patient management decisions.  A negative result may occur with  improper specimen collection / handling, submission of specimen other  than nasopharyngeal swab, presence of viral mutation(s) within the  areas targeted by this assay, and inadequate number of viral copies  (<250 copies / mL). A negative result must be combined with clinical  observations, patient history, and epidemiological information. If result is POSITIVE SARS-CoV-2 target nucleic acids ar e DETECTED. The SARS-CoV-2 RNA is generally detectable in upper and lower  respiratory specimens during the acute phase of infection.  Positive  results are indicative of active infection with SARS-CoV-2.  Clinical  correlation with patient history and other diagnostic information is  necessary to determine patient infection status.  Positive results do  not rule  out bacterial infection or co-infection with other viruses. If result is PRESUMPTIVE POSTIVE SARS-CoV-2 nucleic acids MAY BE PRESENT.   A presumptive positive result was obtained on the submitted specimen  and confirmed on repeat testing.  While 2019 novel coronavirus  (SARS-CoV-2) nucleic acids may be present in the submitted sample  additional confirmatory testing may be necessary for epidemiological  and / or clinical management purposes  to differentiate between  SARS-CoV-2 and other Sarbecovirus currently known to infect humans.  If clinically indicated additional testing with an alternate test  methodolog y [phone] is advised. The SARS-CoV-2 RNA is generally  detectable in upper and lower respiratory specimens during the acute  phase of infection. The expected result is Negative. Fact Sheet for Patients:   BoilerBrush.com.cyhttps://www.fda.gov/media/136312/download Fact Sheet for Healthcare Providers: https://pope.com/https://www.fda.gov/media/136313/download This test is not yet approved or cleared by the [place] FDA and has been authorized for detection and/or diagnosis of SARS-CoV-2 by FDA under an Emergency Use Authorization (EUA).  This EUA will remain in effect (meaning this test can be used) for the duration of the COVID-19 declaration under Section 564(b)(1) of the Act, 21 U.S.C. section 360bbb-3(b)(1), unless the authorization is terminated or revoked sooner. Performed at [facility], [address] [place], [place] [address]   Blood Culture (routine x 2)     Status: None (Preliminary result)   Collection Time: 01/31/19 11:38 PM   Specimen: BLOOD  Result Value Ref Range Status   Specimen Description   Final    BLOOD RIGHT ANTECUBITAL Performed at [facility], [address] [place], [place] [address]    Special Requests   Final    BOTTLES DRAWN AEROBIC AND ANAEROBIC Blood Culture adequate volume Performed at [facility], [address] [place], [place] [address]    Culture   Final    NO GROWTH 3 DAYS Performed at [facility], [address] [place], [place] [address]    Report Status PENDING  Incomplete  Blood Culture (routine x 2)     Status: None (Preliminary result)   Collection Time: 01/31/19 11:43 PM   Specimen: BLOOD LEFT FOREARM  Result Value Ref Range Status   Specimen Description   Final    BLOOD LEFT FOREARM Performed at [facility], [address] [place], [place] [address]    Special Requests   Final    BOTTLES DRAWN AEROBIC ONLY Blood Culture adequate volume Performed at [facility], [address] [place], [place] [address]    Culture   Final    NO GROWTH 3 DAYS Performed at [facility], [address] [place], [place] [address]    Report Status PENDING  Incomplete     Radiology Reports Dg Chest Port 1 View  Result Date: 02/01/2019 CLINICAL DATA:  Dyspnea. Weakness, shortness of breath, body aches, and fever. EXAM: PORTABLE CHEST 1 VIEW COMPARISON:  08/29/2009 FINDINGS: Mild cardiomegaly. Vascular congestion. Mild pulmonary edema. No confluent airspace disease, large pleural effusion or pneumothorax. No acute osseous abnormalities are seen. IMPRESSION: Mild cardiomegaly with vascular congestion and mild pulmonary edema. Electronically Signed   By: Narda RutherfordMelanie  Sanford M.D.   On: 02/01/2019 19:30

## 2019-02-04 NOTE — Progress Notes (Signed)
Assumed care of pt at 1100. Agree with previous RN assessment. Will continue plan of care.  Juletta Berhe K, RN  

## 2019-02-04 NOTE — Progress Notes (Signed)
Patient facetiming with friend. Asked patient if I could answer any questions at this time. Patient denies having any questions.

## 2019-02-05 LAB — CBC WITH DIFFERENTIAL/PLATELET
Abs Immature Granulocytes: 0.42 10*3/uL — ABNORMAL HIGH (ref 0.00–0.07)
Basophils Absolute: 0.1 10*3/uL (ref 0.0–0.1)
Basophils Relative: 1 %
Eosinophils Absolute: 0 10*3/uL (ref 0.0–0.5)
Eosinophils Relative: 0 %
HCT: 41 % (ref 36.0–46.0)
Hemoglobin: 13.4 g/dL (ref 12.0–15.0)
Immature Granulocytes: 5 %
Lymphocytes Relative: 25 %
Lymphs Abs: 2 10*3/uL (ref 0.7–4.0)
MCH: 26.2 pg (ref 26.0–34.0)
MCHC: 32.7 g/dL (ref 30.0–36.0)
MCV: 80.2 fL (ref 80.0–100.0)
Monocytes Absolute: 0.6 10*3/uL (ref 0.1–1.0)
Monocytes Relative: 7 %
Neutro Abs: 4.9 10*3/uL (ref 1.7–7.7)
Neutrophils Relative %: 62 %
Platelets: 337 10*3/uL (ref 150–400)
RBC: 5.11 MIL/uL (ref 3.87–5.11)
RDW: 14.5 % (ref 11.5–15.5)
WBC: 7.9 10*3/uL (ref 4.0–10.5)
nRBC: 0 % (ref 0.0–0.2)

## 2019-02-05 LAB — COMPREHENSIVE METABOLIC PANEL
ALT: 28 U/L (ref 0–44)
AST: 19 U/L (ref 15–41)
Albumin: 3 g/dL — ABNORMAL LOW (ref 3.5–5.0)
Alkaline Phosphatase: 85 U/L (ref 38–126)
Anion gap: 12 (ref 5–15)
BUN: 26 mg/dL — ABNORMAL HIGH (ref 6–20)
CO2: 22 mmol/L (ref 22–32)
Calcium: 9.1 mg/dL (ref 8.9–10.3)
Chloride: 105 mmol/L (ref 98–111)
Creatinine, Ser: 0.78 mg/dL (ref 0.44–1.00)
GFR calc Af Amer: >60 mL/min (ref >60)
GFR calc non Af Amer: >60 mL/min (ref >60)
Glucose, Bld: 142 mg/dL — ABNORMAL HIGH (ref 70–99)
Potassium: 4.1 mmol/L (ref 3.5–5.1)
Sodium: 139 mmol/L (ref 135–145)
Total Bilirubin: 0.3 mg/dL (ref 0.3–1.2)
Total Protein: 6.9 g/dL (ref 6.5–8.1)

## 2019-02-05 LAB — D-DIMER, QUANTITATIVE: D-Dimer, Quant: 0.44 ug/mL-FEU (ref 0.00–0.50)

## 2019-02-05 LAB — FERRITIN: Ferritin: 49 ng/mL (ref 11–307)

## 2019-02-05 LAB — GLUCOSE, CAPILLARY: Glucose-Capillary: 126 mg/dL — ABNORMAL HIGH (ref 70–99)

## 2019-02-05 LAB — C-REACTIVE PROTEIN: CRP: 1.6 mg/dL — ABNORMAL HIGH (ref <1.0)

## 2019-02-05 MED ORDER — PREDNISONE 10 MG PO TABS: ORAL_TABLET | ORAL | 0 refills | Status: DC

## 2019-02-05 MED ORDER — BENZONATATE 100 MG PO CAPS: 100.0000 mg | ORAL_CAPSULE | Freq: Three times a day (TID) | ORAL | 0 refills | Status: AC | PRN

## 2019-02-05 MED ORDER — ALBUTEROL SULFATE HFA 108 (90 BASE) MCG/ACT IN AERS: 2.0000 | INHALATION_SPRAY | Freq: Four times a day (QID) | RESPIRATORY_TRACT | 0 refills | Status: DC | PRN

## 2019-02-05 NOTE — Progress Notes (Signed)
Patient updating family regularly via cell phone. Declined offer to cal and update family by nursing

## 2019-02-05 NOTE — Discharge Summary (Addendum)
PATIENT DETAILS Name: Judith Kelly Age: 48 y.o. Sex: female Date of Birth: [birth year] MRN: [id number]. Admitting Physician: Bobette Moavid Manuel Ortiz, MD ION:[id number]:Hollis, Rosalene BillingsLachina M, FNP  Admit Date: 01/31/2019 Discharge date: 02/05/2019  Recommendations for Outpatient Follow-up:  1. Follow up with PCP in 1-2 weeks 2. Please obtain BMP/CBC in one week 3. Please repeat chest x-ray in 4 to 6 weeks to document resolution of pneumonia   Admitted From:  Home  Disposition: Home   Home Health: No  Equipment/Devices: None  Discharge Condition: Stable  CODE STATUS: FULL CODE  Diet recommendation:  Diet Order            Diet - low sodium heart healthy        Diet Carb Modified        Diet heart healthy/carb modified Room service appropriate? Yes; Fluid consistency: Thin  Diet effective now               Brief Summary: See H&P, Labs, Consult and Test reports for all details in brief, Patient is a 48 y.o. female with PMHx of HTN, hypothyroidism, DM-2-presented with several days history of worsening cough, shortness of breath-her husband recently had COVID-19-she was subsequently found to have acute hypoxic respiratory failure secondary to COVID-19 pneumonia.  Brief Hospital Course: Acute Hypoxic Resp Failure due to Covid 19 Viral pneumonia: Much improved-has been titrated to room air.  Has completed a course of Remdesivir, will continue with tapering steroids for a few more days.  Follow with PCP-and consider repeating chest x-ray in 4-6 weeks to document resolution of pneumonia  COVID-19 Labs  Recent Labs    02/03/19 0302 02/04/19 0432 02/05/19 0420  DDIMER 0.43 0.43 0.44  FERRITIN 81 59 49  CRP 3.2* 1.5* 1.6*    Lab Results  Component Value Date   SARSCOV2NAA POSITIVE (A) 01/31/2019   COVID-19 Medications: Steroids: Decadron 8/19 x 1, Solu-Medrol 8/20>>8/24, Tapering prednisone 8/24>> Remdesivir: 8/20>>8/24 Actemra: not given Convalescent Plasma:N/A  Research Studies:N/A  DM-2: CBGs stable with SSI-resume oral hypoglycemic agents on discharge  HTN: Controlled-continue lisinopril and HCTZ  Hypothyroidism: Continue with Synthroid  Obesity: Estimated body mass index is 38.92 kg/m as calculated from the following:   Height as of this encounter: 5' (1.524 m).   Weight as of this encounter: 90.4 kg.    Procedures/Studies: None  Discharge Diagnoses:  Principal Problem:   Pneumonia due to COVID-19 virus Active Problems:   HYPOTHYROIDISM, POST-RADIOACTIVE IODINE   Essential hypertension, benign   Hypertension   Type 2 diabetes mellitus (HCC)   Discharge Instructions:  Activity:  As tolerated   Discharge Instructions    Call MD for:  difficulty breathing, headache or visual disturbances   Complete by: As directed    Call MD for:  extreme fatigue   Complete by: As directed    Call MD for:  persistant nausea and vomiting   Complete by: As directed    Call MD for:  temperature >100.4   Complete by: As directed    Diet - low sodium heart healthy   Complete by: As directed    Diet Carb Modified   Complete by: As directed    Discharge instructions   Complete by: As directed    Follow with Primary MD  Massie MaroonHollis, Lachina M, FNP in 1-2 weeks  Please get a complete blood count and chemistry panel checked by your Primary MD at your next visit, and again as instructed by your Primary MD.  Get Medicines reviewed  and adjusted: Please take all your medications with you for your next visit with your Primary MD  Laboratory/radiological data: Please request your Primary MD to go over all hospital tests and procedure/radiological results at the follow up, please ask your Primary MD to get all Hospital records sent to his/her office.  In some cases, they will be blood work, cultures and biopsy results pending at the time of your discharge. Please request that your primary care M.D. follows up on these results.  Also Note the  following: If you experience worsening of your admission symptoms, develop shortness of breath, life threatening emergency, suicidal or homicidal thoughts you must seek medical attention immediately by calling 911 or calling your MD immediately  if symptoms less severe.  You must read complete instructions/literature along with all the possible adverse reactions/side effects for all the Medicines you take and that have been prescribed to you. Take any new Medicines after you have completely understood and accpet all the possible adverse reactions/side effects.   Do not drive when taking Pain medications or sleeping medications (Benzodaizepines)  Do not take more than prescribed Pain, Sleep and Anxiety Medications. It is not advisable to combine anxiety,sleep and pain medications without talking with your primary care practitioner  Special Instructions: If you have smoked or chewed Tobacco  in the last 2 yrs please stop smoking, stop any regular Alcohol  and or any Recreational drug use.  Wear Seat belts while driving.  Please note: You were cared for by a hospitalist during your hospital stay. Once you are discharged, your primary care physician will handle any further medical issues. Please note that NO REFILLS for any discharge medications will be authorized once you are discharged, as it is imperative that you return to your primary care physician (or establish a relationship with a primary care physician if you do not have one) for your post hospital discharge needs so that they can reassess your need for medications and monitor your lab values.  ?   Person Under Monitoring Name: Judith Kelly  Location: [address] [place] [address]   Infection Prevention Recommendations for Individuals Confirmed to have, or Being Evaluated for, 2019 Novel Coronavirus (COVID-19) Infection Who Receive Care at Home  Individuals who are confirmed to have, or are being evaluated for,  COVID-19 should follow the prevention steps below until a healthcare provider or local or state health department says they can return to normal activities.  Stay home except to get medical care You should restrict activities outside your home, except for getting medical care. Do not go to work, school, or public areas, and do not use public transportation or taxis.  Call ahead before visiting your doctor Before your medical appointment, call the healthcare provider and tell them that you have, or are being evaluated for, COVID-19 infection. This will help the healthcare provider's office take steps to keep other people from getting infected. Ask your healthcare provider to call the local or state health department.  Monitor your symptoms Seek prompt medical attention if your illness is worsening (e.g., difficulty breathing). Before going to your medical appointment, call the healthcare provider and tell them that you have, or are being evaluated for, COVID-19 infection. Ask your healthcare provider to call the local or state health department.  Wear a facemask You should wear a facemask that covers your nose and mouth when you are in the same room with other people and when you visit a healthcare provider. People who live  with or visit you should also wear a facemask while they are in the same room with you.  Separate yourself from other people in your home As much as possible, you should stay in a different room from other people in your home. Also, you should use a separate bathroom, if available.  Avoid sharing household items You should not share dishes, drinking glasses, cups, eating utensils, towels, bedding, or other items with other people in your home. After using these items, you should wash them thoroughly with soap and water.  Cover your coughs and sneezes Cover your mouth and nose with a tissue when you cough or sneeze, or you can cough or sneeze into your sleeve. Throw  used tissues in a lined trash can, and immediately wash your hands with soap and water for at least 20 seconds or use an alcohol-based hand rub.  Wash your [employer] your hands often and thoroughly with soap and water for at least 20 seconds. You can use an alcohol-based hand sanitizer if soap and water are not available and if your hands are not visibly dirty. Avoid touching your eyes, nose, and mouth with unwashed hands.   Prevention Steps for Caregivers and Household Members of Individuals Confirmed to have, or Being Evaluated for, COVID-19 Infection Being Cared for in the Home  If you live with, or provide care at home for, a person confirmed to have, or being evaluated for, COVID-19 infection please follow these guidelines to prevent infection:  Follow healthcare provider's instructions Make sure that you understand and can help the patient follow any healthcare provider instructions for all care.  Provide for the patient's basic needs You should help the patient with basic needs in the home and provide support for getting groceries, prescriptions, and other personal needs.  Monitor the patient's symptoms If they are getting sicker, call his or her medical provider and tell them that the patient has, or is being evaluated for, COVID-19 infection. This will help the healthcare provider's office take steps to keep other people from getting infected. Ask the healthcare provider to call the local or state health department.  Limit the number of people who have contact with the patient If possible, have only one caregiver for the patient. Other household members should stay in another home or place of residence. If this is not possible, they should stay in another room, or be separated from the patient as much as possible. Use a separate bathroom, if available. Restrict visitors who do not have an essential need to be in the home.  Keep older adults, very young children, and other  sick people away from the patient Keep older adults, very young children, and those who have compromised immune systems or chronic health conditions away from the patient. This includes people with chronic heart, lung, or kidney conditions, diabetes, and cancer.  Ensure good ventilation Make sure that shared spaces in the home have good air flow, such as from an air conditioner or an opened window, weather permitting.  Wash your hands often Wash your hands often and thoroughly with soap and water for at least 20 seconds. You can use an alcohol based hand sanitizer if soap and water are not available and if your hands are not visibly dirty. Avoid touching your eyes, nose, and mouth with unwashed hands. Use disposable paper towels to dry your hands. If not available, use dedicated cloth towels and replace them when they become wet.  Wear a facemask and gloves Wear a  disposable facemask at all times in the room and gloves when you touch or have contact with the patient's blood, body fluids, and/or secretions or excretions, such as sweat, saliva, sputum, nasal mucus, vomit, urine, or feces.  Ensure the mask fits over your nose and mouth tightly, and do not touch it during use. Throw out disposable facemasks and gloves after using them. Do not reuse. Wash your hands immediately after removing your facemask and gloves. If your personal clothing becomes contaminated, carefully remove clothing and launder. Wash your hands after handling contaminated clothing. Place all used disposable facemasks, gloves, and other waste in a lined container before disposing them with other household waste. Remove gloves and wash your hands immediately after handling these items.  Do not share dishes, glasses, or other household items with the patient Avoid sharing household items. You should not share dishes, drinking glasses, cups, eating utensils, towels, bedding, or other items with a patient who is confirmed to have,  or being evaluated for, COVID-19 infection. After the person uses these items, you should wash them thoroughly with soap and water.  Wash laundry thoroughly Immediately remove and wash clothes or bedding that have blood, body fluids, and/or secretions or excretions, such as sweat, saliva, sputum, nasal mucus, vomit, urine, or feces, on them. Wear gloves when handling laundry from the patient. Read and follow directions on labels of laundry or clothing items and detergent. In general, wash and dry with the warmest temperatures recommended on the label.  Clean all areas the individual has used often Clean all touchable surfaces, such as counters, tabletops, doorknobs, bathroom fixtures, toilets, phones, keyboards, tablets, and bedside tables, every day. Also, clean any surfaces that may have blood, body fluids, and/or secretions or excretions on them. Wear gloves when cleaning surfaces the patient has come in contact with. Use a diluted bleach solution (e.g., dilute bleach with 1 part bleach and 10 parts water) or a household disinfectant with a label that says EPA-registered for coronaviruses. To make a bleach solution at home, add 1 tablespoon of bleach to 1 quart (4 cups) of water. For a larger supply, add  cup of bleach to 1 gallon (16 cups) of water. Read labels of cleaning products and follow recommendations provided on product labels. Labels contain instructions for safe and effective use of the cleaning product including precautions you should take when applying the product, such as wearing gloves or eye protection and making sure you have good ventilation during use of the product. Remove gloves and wash hands immediately after cleaning.  Monitor yourself for signs and symptoms of illness Caregivers and household members are considered close contacts, should monitor their health, and will be asked to limit movement outside of the home to the extent possible. Follow the monitoring steps for  close contacts listed on the symptom monitoring form.   ? If you have additional questions, contact your local health department or call the epidemiologist on call at [phone] (available 24/7). ? This guidance is subject to change. For the most up-to-date guidance from [facility], please refer to their website: [email or web address]   Increase activity slowly   Complete by: As directed      Allergies as of 02/05/2019   No Known Allergies     Medication List    STOP taking these medications   azithromycin 250 MG tablet Commonly known as: ZITHROMAX     TAKE these medications   acetaminophen 325 MG tablet Commonly known as: TYLENOL Take 650 mg by mouth every 6 (  six) hours as needed for fever.   albuterol 108 (90 Base) MCG/ACT inhaler Commonly known as: VENTOLIN HFA Inhale 2 puffs into the lungs every 6 (six) hours as needed for wheezing or shortness of breath.   benzonatate 100 MG capsule Commonly known as: Tessalon Perles Take 1 capsule (100 mg total) by mouth 3 (three) times daily as needed for cough.   hydrochlorothiazide 25 MG tablet Commonly known as: HYDRODIURIL Take 1 tablet (25 mg total) by mouth daily.   lisinopril 5 MG tablet Commonly known as: ZESTRIL Take 1 tablet (5 mg total) by mouth daily.   loratadine 10 MG tablet Commonly known as: CLARITIN Take 10 mg by mouth daily.   metFORMIN 500 MG tablet Commonly known as: GLUCOPHAGE Take 1 tablet (500 mg total) by mouth daily with breakfast.   predniSONE 10 MG tablet Commonly known as: DELTASONE Take 40 mg daily for 1 day, 30 mg daily for 1 day, 20 mg daily for 1 days,10 mg daily for 1 day, then stop   Synthroid 125 MCG tablet Generic drug: levothyroxine Take 125 mcg by mouth every morning. What changed: Another medication with the same name was removed. Continue taking this medication, and follow the directions you see here.      Follow-up Information     Massie Maroon, FNP. Schedule an appointment as soon as possible for a visit in 1 week(s).   Specialty: Family Medicine Contact information: [address] [place] [phone]          No Known Allergies  Consultations:   None  Other Procedures/Studies: Dg Chest Port 1 View  Result Date: 02/01/2019 CLINICAL DATA:  Dyspnea. Weakness, shortness of breath, body aches, and fever. EXAM: PORTABLE CHEST 1 VIEW COMPARISON:  08/29/2009 FINDINGS: Mild cardiomegaly. Vascular congestion. Mild pulmonary edema. No confluent airspace disease, large pleural effusion or pneumothorax. No acute osseous abnormalities are seen. IMPRESSION: Mild cardiomegaly with vascular congestion and mild pulmonary edema. Electronically Signed   By: Narda Rutherford M.D.   On: 02/01/2019 19:30      TODAY-DAY OF DISCHARGE:  Subjective:   Judith Kelly today has no headache,no chest abdominal pain,no new weakness tingling or numbness, feels much better wants to go home today.   Objective:   Blood pressure 111/75, pulse (!) 52, temperature 98.2 F (36.8 C), temperature source Oral, resp. rate 18, height 5' (1.524 m), weight 90.4 kg, SpO2 93 %.  Intake/Output Summary (Last 24 hours) at 02/05/2019 1100 Last data filed at 02/04/2019 1903 Gross per 24 hour  Intake 480 ml  Output -  Net 480 ml   Filed Weights   01/31/19 2019 02/01/19 0200  Weight: 92.5 kg 90.4 kg    Exam: Awake Alert, Oriented *3, No new F.N deficits, Normal affect [place].AT,PERRAL Supple Neck,No JVD, No cervical lymphadenopathy appriciated.  Symmetrical Chest wall movement, Good air movement bilaterally, CTAB RRR,No Gallops,Rubs or new Murmurs, No Parasternal Heave +ve B.Sounds, Abd Soft, Non tender, No organomegaly appriciated, No rebound -guarding or rigidity. No Cyanosis, Clubbing or edema, No new Rash or bruise   PERTINENT RADIOLOGIC STUDIES: Dg Chest Port 1 View  Result Date: 02/01/2019  CLINICAL DATA:  Dyspnea. Weakness, shortness of breath, body aches, and fever. EXAM: PORTABLE CHEST 1 VIEW COMPARISON:  08/29/2009 FINDINGS: Mild cardiomegaly. Vascular congestion. Mild pulmonary edema. No confluent airspace disease, large pleural effusion or pneumothorax. No acute osseous abnormalities are seen. IMPRESSION: Mild cardiomegaly with vascular congestion and mild pulmonary edema. Electronically Signed  By: Keith Rake M.D.   On: 02/01/2019 19:30     PERTINENT LAB RESULTS: CBC: Recent Labs    02/04/19 0432 02/05/19 0420  WBC 7.1 7.9  HGB 12.7 13.4  HCT 39.4 41.0  PLT 326 337   CMET CMP     Component Value Date/Time   NA 139 02/05/2019 0420   K 4.1 02/05/2019 0420   CL 105 02/05/2019 0420   CO2 22 02/05/2019 0420   GLUCOSE 142 (H) 02/05/2019 0420   BUN 26 (H) 02/05/2019 0420   CREATININE 0.78 02/05/2019 0420   CREATININE 0.70 08/19/2015 1133   CALCIUM 9.1 02/05/2019 0420   PROT 6.9 02/05/2019 0420   ALBUMIN 3.0 (L) 02/05/2019 0420   AST 19 02/05/2019 0420   ALT 28 02/05/2019 0420   ALKPHOS 85 02/05/2019 0420   BILITOT 0.3 02/05/2019 0420   GFRNONAA >60 02/05/2019 0420   GFRNONAA >89 08/19/2015 1133   GFRAA >60 02/05/2019 0420   GFRAA >89 08/19/2015 1133    GFR Estimated Creatinine Clearance: 85.3 mL/min (by C-G formula based on SCr of 0.78 mg/dL). No results for input(s): LIPASE, AMYLASE in the last 72 hours. No results for input(s): CKTOTAL, CKMB, CKMBINDEX, TROPONINI in the last 72 hours. Invalid input(s): [place]    02/04/19 0432 02/05/19 0420  DDIMER 0.43 0.44   No results for input(s): HGBA1C in the last 72 hours. No results for input(s): CHOL, HDL, LDLCALC, TRIG, CHOLHDL, LDLDIRECT in the last 72 hours. No results for input(s): TSH, T4TOTAL, T3FREE, THYROIDAB in the last 72 hours.  Invalid input(s): FREET3 Recent Labs    02/04/19 0432 02/05/19 0420  FERRITIN 59 49   Coags: No results for input(s): INR in the last 72  hours.  Invalid input(s): PT Microbiology: Recent Results (from the past 240 hour(s))  SARS Coronavirus 2 [facility] order, Performed in [facility] hospital lab) Nasopharyngeal Nasopharyngeal Swab     Status: Abnormal   Collection Time: 01/31/19  8:30 PM   Specimen: Nasopharyngeal Swab  Result Value Ref Range Status   SARS Coronavirus 2 POSITIVE (A) NEGATIVE Final    Comment: CRITICAL RESULT CALLED TO, READ BACK BY AND VERIFIED WITH: RN Sharene Skeans AT 2142 01/31/19 CRUICKSHANK A (NOTE) If result is NEGATIVE SARS-CoV-2 target nucleic acids are NOT DETECTED. The SARS-CoV-2 RNA is generally detectable in upper and lower  respiratory specimens during the acute phase of infection. The lowest  concentration of SARS-CoV-2 viral copies this assay can detect is 250  copies / mL. A negative result does not preclude SARS-CoV-2 infection  and should not be used as the sole basis for treatment or other  patient management decisions.  A negative result may occur with  improper specimen collection / handling, submission of specimen other  than nasopharyngeal swab, presence of viral mutation(s) within the  areas targeted by this assay, and inadequate number of viral copies  (<250 copies / mL). A negative result must be combined with clinical  observations, patient history, and epidemiological information. If result is POSITIVE SARS-CoV-2 target nucleic acids ar e DETECTED. The SARS-CoV-2 RNA is generally detectable in upper and lower  respiratory specimens during the acute phase of infection.  Positive  results are indicative of active infection with SARS-CoV-2.  Clinical  correlation with patient history and other diagnostic information is  necessary to determine patient infection status.  Positive results do  not rule out bacterial infection or co-infection with other viruses. If result is PRESUMPTIVE POSTIVE SARS-CoV-2 nucleic acids  MAY BE PRESENT.   A presumptive positive result was obtained on  the submitted specimen  and confirmed on repeat testing.  While 2019 novel coronavirus  (SARS-CoV-2) nucleic acids may be present in the submitted sample  additional confirmatory testing may be necessary for epidemiological  and / or clinical management purposes  to differentiate between  SARS-CoV-2 and other Sarbecovirus currently known to infect humans.  If clinically indicated additional testing with an alternate test  methodolog y [phone]([id number]) is advised. The SARS-CoV-2 RNA is generally  detectable in upper and lower respiratory specimens during the acute  phase of infection. The expected result is Negative. Fact Sheet for Patients:  BoilerBrush.com.cyhttps://www.fda.gov/media/136312/download Fact Sheet for Healthcare Providers: https://pope.com/https://www.fda.gov/media/136313/download This test is not yet approved or cleared by the [place] FDA and has been authorized for detection and/or diagnosis of SARS-CoV-2 by FDA under an Emergency Use Authorization (EUA).  This EUA will remain in effect (meaning this test can be used) for the duration of the COVID-19 declaration under Section 564(b)(1) of the Act, 21 U.S.C. section 360bbb-3(b)(1), unless the authorization is terminated or revoked sooner. Performed at [facility], [address] [place], [place] [address]   Blood Culture (routine x 2)     Status: None (Preliminary result)   Collection Time: 01/31/19 11:38 PM   Specimen: BLOOD  Result Value Ref Range Status   Specimen Description   Final    BLOOD RIGHT ANTECUBITAL Performed at [facility], [address] [place], [place] [address]    Special Requests   Final    BOTTLES DRAWN AEROBIC AND ANAEROBIC Blood Culture adequate volume Performed at [facility], [address] [place], [place] [address]    Culture   Final    NO GROWTH 4 DAYS Performed at [facility], [address] [place], [place] [address]    Report Status PENDING   Incomplete  Blood Culture (routine x 2)     Status: None (Preliminary result)   Collection Time: 01/31/19 11:43 PM   Specimen: BLOOD LEFT FOREARM  Result Value Ref Range Status   Specimen Description   Final    BLOOD LEFT FOREARM Performed at [facility], [address] [place], [place] [address]    Special Requests   Final    BOTTLES DRAWN AEROBIC ONLY Blood Culture adequate volume Performed at [facility], [address] [place], [place] [address]    Culture   Final    NO GROWTH 4 DAYS Performed at [facility], [address] [place], [place] [address]    Report Status PENDING  Incomplete    FURTHER DISCHARGE INSTRUCTIONS:  Get Medicines reviewed and adjusted: Please take all your medications with you for your next visit with your Primary MD  Laboratory/radiological data: Please request your Primary MD to go over all hospital tests and procedure/radiological results at the follow up, please ask your Primary MD to get all Hospital records sent to his/her office.  In some cases, they will be blood work, cultures and biopsy results pending at the time of your discharge. Please request that your primary care M.D. goes through all the records of your hospital data and follows up on these results.  Also Note the following: If you experience worsening of your admission symptoms, develop shortness of breath, life threatening emergency, suicidal or homicidal thoughts you must seek medical attention immediately by calling 911 or calling your MD immediately  if  symptoms less severe.  You must read complete instructions/literature along with all the possible adverse reactions/side effects for all the Medicines you take and that have been prescribed to you. Take any new Medicines after you have completely understood and accpet all the possible adverse reactions/side effects.   Do not drive when taking Pain medications or sleeping medications  (Benzodaizepines)  Do not take more than prescribed Pain, Sleep and Anxiety Medications. It is not advisable to combine anxiety,sleep and pain medications without talking with your primary care practitioner  Special Instructions: If you have smoked or chewed Tobacco  in the last 2 yrs please stop smoking, stop any regular Alcohol  and or any Recreational drug use.  Wear Seat belts while driving.  Please note: You were cared for by a hospitalist during your hospital stay. Once you are discharged, your primary care physician will handle any further medical issues. Please note that NO REFILLS for any discharge medications will be authorized once you are discharged, as it is imperative that you return to your primary care physician (or establish a relationship with a primary care physician if you do not have one) for your post hospital discharge needs so that they can reassess your need for medications and monitor your lab values.  Total Time spent coordinating discharge including counseling, education and face to face time equals 35 minutes.  Signed: Addis Bennie 02/05/2019 11:00 AM

## 2019-02-05 NOTE — Discharge Instructions (Signed)
Person Under Monitoring Name: Judith Kelly  Location: [place] [address]   Infection Prevention Recommendations for Individuals Confirmed to have, or Being Evaluated for, 2019 Novel Coronavirus (COVID-19) Infection Who Receive Care at Home  Individuals who are confirmed to have, or are being evaluated for, COVID-19 should follow the prevention steps below until a healthcare provider or local or state health department says they can return to normal activities.  Stay home except to get medical care You should restrict activities outside your home, except for getting medical care. Do not go to work, school, or public areas, and do not use public transportation or taxis.  Call ahead before visiting your doctor Before your medical appointment, call the healthcare provider and tell them that you have, or are being evaluated for, COVID-19 infection. This will help the healthcare providers office take steps to keep other people from getting infected. Ask your healthcare provider to call the local or state health department.  Monitor your symptoms Seek prompt medical attention if your illness is worsening (e.g., difficulty breathing). Before going to your medical appointment, call the healthcare provider and tell them that you have, or are being evaluated for, COVID-19 infection. Ask your healthcare provider to call the local or state health department.  Wear a facemask You should wear a facemask that covers your nose and mouth when you are in the same room with other people and when you visit a healthcare provider. People who live with or visit you should also wear a facemask while they are in the same room with you.  Separate yourself from other people in your home As much as possible, you should stay in a different room from other people in your home. Also, you should use a separate bathroom, if available.  Avoid sharing household items You should not  share dishes, drinking glasses, cups, eating utensils, towels, bedding, or other items with other people in your home. After using these items, you should wash them thoroughly with soap and water.  Cover your coughs and sneezes Cover your mouth and nose with a tissue when you cough or sneeze, or you can cough or sneeze into your sleeve. Throw used tissues in a lined trash can, and immediately wash your hands with soap and water for at least 20 seconds or use an alcohol-based hand rub.  Wash your [facility] [employer] your hands often and thoroughly with soap and water for at least 20 seconds. You can use an alcohol-based hand sanitizer if soap and water are not available and if your hands are not visibly dirty. Avoid touching your eyes, nose, and mouth with unwashed hands.   Prevention Steps for Caregivers and Household Members of Individuals Confirmed to have, or Being Evaluated for, COVID-19 Infection Being Cared for in the Home  If you live with, or provide care at home for, a person confirmed to have, or being evaluated for, COVID-19 infection please follow these guidelines to prevent infection:  Follow healthcare providers instructions Make sure that you understand and can help the patient follow any healthcare provider instructions for all care.  Provide for the patients basic needs You should help the patient with basic needs in the home and provide support for getting groceries, prescriptions, and other personal needs.  Monitor the patients symptoms If they are getting sicker, call his or her medical provider and tell them that the patient has, or is being evaluated for, COVID-19 infection. This will help the healthcare providers office  take steps to keep other people from getting infected. Ask the healthcare provider to call the local or state health department.  Limit the number of people who have contact with the patient  If possible, have only one caregiver for the  patient.  Other household members should stay in another home or place of residence. If this is not possible, they should stay  in another room, or be separated from the patient as much as possible. Use a separate bathroom, if available.  Restrict visitors who do not have an essential need to be in the home.  Keep older adults, very young children, and other sick people away from the patient Keep older adults, very young children, and those who have compromised immune systems or chronic health conditions away from the patient. This includes people with chronic heart, lung, or kidney conditions, diabetes, and cancer.  Ensure good ventilation Make sure that shared spaces in the home have good air flow, such as from an air conditioner or an opened window, weather permitting.  Wash your hands often  Wash your hands often and thoroughly with soap and water for at least 20 seconds. You can use an alcohol based hand sanitizer if soap and water are not available and if your hands are not visibly dirty.  Avoid touching your eyes, nose, and mouth with unwashed hands.  Use disposable paper towels to dry your hands. If not available, use dedicated cloth towels and replace them when they become wet.  Wear a facemask and gloves  Wear a disposable facemask at all times in the room and gloves when you touch or have contact with the patients blood, body fluids, and/or secretions or excretions, such as sweat, saliva, sputum, nasal mucus, vomit, urine, or feces.  Ensure the mask fits over your nose and mouth tightly, and do not touch it during use.  Throw out disposable facemasks and gloves after using them. Do not reuse.  Wash your hands immediately after removing your facemask and gloves.  If your personal clothing becomes contaminated, carefully remove clothing and launder. Wash your hands after handling contaminated clothing.  Place all used disposable facemasks, gloves, and other waste in a lined  container before disposing them with other household waste.  Remove gloves and wash your hands immediately after handling these items.  Do not share dishes, glasses, or other household items with the patient  Avoid sharing household items. You should not share dishes, drinking glasses, cups, eating utensils, towels, bedding, or other items with a patient who is confirmed to have, or being evaluated for, COVID-19 infection.  After the person uses these items, you should wash them thoroughly with soap and water.  Wash laundry thoroughly  Immediately remove and wash clothes or bedding that have blood, body fluids, and/or secretions or excretions, such as sweat, saliva, sputum, nasal mucus, vomit, urine, or feces, on them.  Wear gloves when handling laundry from the patient.  Read and follow directions on labels of laundry or clothing items and detergent. In general, wash and dry with the warmest temperatures recommended on the label.  Clean all areas the individual has used often  Clean all touchable surfaces, such as counters, tabletops, doorknobs, bathroom fixtures, toilets, phones, keyboards, tablets, and bedside tables, every day. Also, clean any surfaces that may have blood, body fluids, and/or secretions or excretions on them.  Wear gloves when cleaning surfaces the patient has come in contact with.  Use a diluted bleach solution (e.g., dilute bleach with 1 part  bleach and 10 parts water) or a household disinfectant with a label that says EPA-registered for coronaviruses. To make a bleach solution at home, add 1 tablespoon of bleach to 1 quart (4 cups) of water. For a larger supply, add  cup of bleach to 1 gallon (16 cups) of water.  Read labels of cleaning products and follow recommendations provided on product labels. Labels contain instructions for safe and effective use of the cleaning product including precautions you should take when applying the product, such as wearing gloves or  eye protection and making sure you have good ventilation during use of the product.  Remove gloves and wash hands immediately after cleaning.  Monitor yourself for signs and symptoms of illness Caregivers and household members are considered close contacts, should monitor their health, and will be asked to limit movement outside of the home to the extent possible. Follow the monitoring steps for close contacts listed on the symptom monitoring form.   ? If you have additional questions, contact your local health department or call the epidemiologist on call at [phone] (available 24/7). ? This guidance is subject to change. For the most up-to-date guidance from [facility], please refer to their website: YouBlogs.pl

## 2019-02-06 LAB — CULTURE, BLOOD (ROUTINE X 2)
Culture: NO GROWTH
Culture: NO GROWTH
Special Requests: ADEQUATE
Special Requests: ADEQUATE

## 2019-06-21 ENCOUNTER — Ambulatory Visit: Payer: No Typology Code available for payment source | Attending: Internal Medicine

## 2019-06-21 DIAGNOSIS — Z20822 Contact with and (suspected) exposure to covid-19: Secondary | ICD-10-CM | POA: Insufficient documentation

## 2019-06-23 LAB — NOVEL CORONAVIRUS, NAA: SARS-CoV-2, NAA: NOT DETECTED

## 2020-01-18 LAB — HM MAMMOGRAPHY

## 2021-07-07 ENCOUNTER — Ambulatory Visit: Payer: Self-pay

## 2021-07-07 ENCOUNTER — Other Ambulatory Visit: Payer: Self-pay

## 2021-07-07 ENCOUNTER — Other Ambulatory Visit: Payer: Self-pay | Admitting: Family Medicine

## 2021-07-07 DIAGNOSIS — M25571 Pain in right ankle and joints of right foot: Secondary | ICD-10-CM

## 2021-08-06 ENCOUNTER — Encounter: Payer: Self-pay | Admitting: Medical

## 2022-02-01 LAB — HM MAMMOGRAPHY

## 2022-09-29 LAB — HM PAP SMEAR

## 2022-10-12 ENCOUNTER — Telehealth: Payer: Self-pay | Admitting: Gastroenterology

## 2022-10-12 NOTE — Telephone Encounter (Signed)
Hi Dr. Meridee Score,   Supervising Provider: 10/12/22-AM  We received a referral for patient to have a colonoscopy done. She does have GI history with [facility] from 2022 requesting a transfer over to [facility] GI records were obtained and scanned into Media for yo to review and advise on scheduling.    Thank you

## 2022-10-12 NOTE — Telephone Encounter (Signed)
This patient is accepted for transfer of care. She has a history of a previous gastric sleeve surgical therapy. I do not see any contraindication for why this patient cannot have a direct colonoscopy. She can be scheduled next available with me. If she needs to be seen in clinic, she can be scheduled with any of the APP's or myself. Thanks. GM

## 2022-10-15 ENCOUNTER — Encounter: Payer: Self-pay | Admitting: Gastroenterology

## 2022-10-20 ENCOUNTER — Telehealth: Payer: Self-pay

## 2022-10-20 NOTE — Telephone Encounter (Signed)
John,  Please review patient's medical record as it is noted on her medical history of Chiari Malformation   Noted in records: MRI 12/22/10---Tonsillar herniation through the foramen magnum, 7 mm on the right and 5 mm on the left. This is consistent with low-grade Chiari malformation. Her dizziness sounds to be vertigonous which can be a symptom of Chiari 1 malformation. UpToDate is that even if vertigo is from a Chiari 1 malformation, that the management is a non-surgical, conservative approach. For asymptomatic or minimally symptomatic patients with an incidental diagnosis of CM-I who do not have syringomyelia, per UpToDate can be managed conservatively with clinical and MRI surveillance yearly.  Decompressive surgery is indicated for patients with CM-I who are clearly symptomatic with lower cranial nerve palsies, syringomyelia, myelopathy, cerebellar symptoms, severe neck pain or occipital headache. To Do:  MRI yearly, monitor for above symptoms at each visit, routine fundoscopic exam   Local  ; Please advise if patient needs to be rescheduled at [facility] or if the patient is cleared for [facility] procedure; it does not state anywhere else where RN can see- that the patient has been officially dx with it; Thanks  Bre

## 2022-10-21 NOTE — Telephone Encounter (Signed)
Patty,  Please see previous messages and advise if patient is able to keep her PV scheduled or not.  The patient will need to be contacted with this information when it is clear if the patient will need an OV or a direct at [facility].  Please let PV know what the decision is as well, as chart prep will need to be done. Thank you Bre

## 2022-10-22 ENCOUNTER — Other Ambulatory Visit: Payer: Self-pay

## 2022-10-22 DIAGNOSIS — Z1211 Encounter for screening for malignant neoplasm of colon: Secondary | ICD-10-CM

## 2022-10-22 NOTE — Telephone Encounter (Signed)
I have her scheduled for hospital colon on 8/1 at 1 pm with GM at [facility].  I entered the referral and the case

## 2022-10-22 NOTE — Telephone Encounter (Signed)
Judith Kelly, I am OK with this patient being scheduled directly on my outpatient hospital day for her colonoscopy. She should be relayed the information that you have written here in regards to reasoning it would make sense for her to be done in the hospital. If she is comfortable with this then direct schedule. If she is not comfortable with this, then schedule clinic visit next available APP and I'll supervise or place on my next available clinic date (do no use an overbook slot). Thanks. GM

## 2022-10-25 NOTE — Telephone Encounter (Signed)
Noted on Pre Visit chart 

## 2022-10-28 ENCOUNTER — Ambulatory Visit: Payer: Commercial Managed Care - HMO

## 2022-10-28 VITALS — Ht <= 58 in | Wt 119.0 lb

## 2022-10-28 DIAGNOSIS — Z1211 Encounter for screening for malignant neoplasm of colon: Secondary | ICD-10-CM

## 2022-10-28 MED ORDER — NA SULFATE-K SULFATE-MG SULF 17.5-3.13-1.6 GM/177ML PO SOLN: 1.0000 | Freq: Once | ORAL | 0 refills | Status: AC

## 2022-10-28 NOTE — Progress Notes (Signed)
No egg or soy allergy known to patient  No issues known to pt with past sedation with any surgeries or procedures Patient denies ever being told they had issues or difficulty with intubation  No FH of Malignant Hyperthermia Pt is not on diet pills Pt is not on  home 02  Pt is not on blood thinners  Pt denies issues with constipation  No A fib or A flutter Have any cardiac testing pending--no Pt instructed to use Singlecare.com or GoodRx for a price reduction on prep  Patient's chart reviewed by Cathlyn Parsons CNRA prior to previsit and patient appropriate for the [facility].  Previsit completed and red dot placed by patient's name on their procedure day (on provider's schedule).   Used video Remote Interpreting Ipad

## 2022-11-26 ENCOUNTER — Encounter: Payer: No Typology Code available for payment source | Admitting: Gastroenterology

## 2022-12-06 ENCOUNTER — Encounter: Payer: Self-pay | Admitting: Gastroenterology

## 2023-01-05 ENCOUNTER — Encounter (HOSPITAL_COMMUNITY): Payer: Self-pay | Admitting: Gastroenterology

## 2023-01-12 NOTE — Anesthesia Preprocedure Evaluation (Signed)
Anesthesia Evaluation  Patient identified by MRN, date of birth, ID band Patient awake    Reviewed: Allergy & Precautions, NPO status , Patient's Chart, lab work & pertinent test results  Airway Mallampati: II  TM Distance: >3 FB Neck ROM: Full    Dental  (+) Dental Advisory Given, Missing   Pulmonary sleep apnea    Pulmonary exam normal breath sounds clear to auscultation       Cardiovascular hypertension, Pt. on medications Normal cardiovascular exam+ dysrhythmias Atrial Fibrillation  Rhythm:Regular Rate:Normal     Neuro/Psych  Headaches    GI/Hepatic negative GI ROS, Neg liver ROS,,,  Endo/Other  diabetes, Type 2, Oral Hypoglycemic AgentsHypothyroidism    Renal/GU negative Renal ROS     Musculoskeletal negative musculoskeletal ROS (+)    Abdominal   Peds  Hematology negative hematology ROS (+)   Anesthesia Other Findings   Reproductive/Obstetrics                             Anesthesia Physical Anesthesia Plan  ASA: 3  Anesthesia Plan: MAC   Post-op Pain Management: Minimal or no pain anticipated   Induction: Intravenous  PONV Risk Score and Plan: 2 and TIVA and Treatment may vary due to age or medical condition  Airway Management Planned: Natural Airway and Simple Face Mask  Additional Equipment:   Intra-op Plan:   Post-operative Plan:   Informed Consent: I have reviewed the patients History and Physical, chart, labs and discussed the procedure including the risks, benefits and alternatives for the proposed anesthesia with the patient or authorized representative who has indicated his/her understanding and acceptance.     Dental advisory given  Plan Discussed with: CRNA  Anesthesia Plan Comments:        Anesthesia Quick Evaluation

## 2023-01-13 ENCOUNTER — Ambulatory Visit (HOSPITAL_COMMUNITY): Payer: Commercial Managed Care - HMO | Admitting: Anesthesiology

## 2023-01-13 ENCOUNTER — Other Ambulatory Visit: Payer: Self-pay

## 2023-01-13 ENCOUNTER — Ambulatory Visit (HOSPITAL_COMMUNITY)
Admission: RE | Admit: 2023-01-13 | Discharge: 2023-01-13 | Disposition: A | Discharge status: Home / Self Care | Payer: Commercial Managed Care - HMO | Attending: Gastroenterology | Admitting: Gastroenterology

## 2023-01-13 ENCOUNTER — Ambulatory Visit (HOSPITAL_BASED_OUTPATIENT_CLINIC_OR_DEPARTMENT_OTHER): Payer: Commercial Managed Care - HMO | Admitting: Anesthesiology

## 2023-01-13 ENCOUNTER — Encounter (HOSPITAL_COMMUNITY)
Admission: RE | Disposition: A | Discharge status: Home / Self Care | Payer: Self-pay | Source: Home / Self Care | Attending: Gastroenterology

## 2023-01-13 ENCOUNTER — Encounter (HOSPITAL_COMMUNITY): Payer: Self-pay | Admitting: Gastroenterology

## 2023-01-13 DIAGNOSIS — E119 Type 2 diabetes mellitus without complications: Secondary | ICD-10-CM | POA: Diagnosis not present

## 2023-01-13 DIAGNOSIS — K641 Second degree hemorrhoids: Secondary | ICD-10-CM | POA: Insufficient documentation

## 2023-01-13 DIAGNOSIS — I10 Essential (primary) hypertension: Secondary | ICD-10-CM | POA: Insufficient documentation

## 2023-01-13 DIAGNOSIS — Z7984 Long term (current) use of oral hypoglycemic drugs: Secondary | ICD-10-CM | POA: Diagnosis not present

## 2023-01-13 DIAGNOSIS — K644 Residual hemorrhoidal skin tags: Secondary | ICD-10-CM | POA: Insufficient documentation

## 2023-01-13 DIAGNOSIS — Z1211 Encounter for screening for malignant neoplasm of colon: Secondary | ICD-10-CM | POA: Insufficient documentation

## 2023-01-13 DIAGNOSIS — K649 Unspecified hemorrhoids: Secondary | ICD-10-CM | POA: Diagnosis not present

## 2023-01-13 DIAGNOSIS — Z79899 Other long term (current) drug therapy: Secondary | ICD-10-CM | POA: Insufficient documentation

## 2023-01-13 DIAGNOSIS — G4730 Sleep apnea, unspecified: Secondary | ICD-10-CM | POA: Diagnosis not present

## 2023-01-13 HISTORY — PX: COLONOSCOPY WITH PROPOFOL: SHX5780

## 2023-01-13 SURGERY — COLONOSCOPY WITH PROPOFOL
Anesthesia: Monitor Anesthesia Care

## 2023-01-13 MED ORDER — PROPOFOL 1000 MG/100ML IV EMUL
INTRAVENOUS | Status: AC
  Filled 2023-01-13: qty 100

## 2023-01-13 MED ORDER — GLYCOPYRROLATE 0.2 MG/ML IJ SOLN
INTRAMUSCULAR | Status: DC | PRN
Start: 2023-01-13 — End: 2023-01-13
  Administered 2023-01-13 (×2): .1 mg via INTRAVENOUS

## 2023-01-13 MED ORDER — PROPOFOL 10 MG/ML IV BOLUS
INTRAVENOUS | Status: DC | PRN
  Administered 2023-01-13: 50 mg via INTRAVENOUS
  Administered 2023-01-13: 40 mg via INTRAVENOUS

## 2023-01-13 MED ORDER — PROPOFOL 10 MG/ML IV BOLUS
INTRAVENOUS | Status: AC
  Filled 2023-01-13: qty 20

## 2023-01-13 MED ORDER — LIDOCAINE 2% (20 MG/ML) 5 ML SYRINGE
INTRAMUSCULAR | Status: DC | PRN
  Administered 2023-01-13: 60 mg via INTRAVENOUS

## 2023-01-13 MED ORDER — PROPOFOL 500 MG/50ML IV EMUL
INTRAVENOUS | Status: DC | PRN
  Administered 2023-01-13: 110 ug/kg/min via INTRAVENOUS

## 2023-01-13 MED ORDER — LACTATED RINGERS IV SOLN: INTRAVENOUS | Status: DC

## 2023-01-13 SURGICAL SUPPLY — 22 items

## 2023-01-13 NOTE — Transfer of Care (Signed)
Immediate Anesthesia Transfer of Care Note  Patient: Judith Kelly  Procedure(s) Performed: COLONOSCOPY WITH PROPOFOL  Patient Location: PACU and Endoscopy Unit  Anesthesia Type:MAC  Level of Consciousness: awake, alert , and oriented  Airway & Oxygen Therapy: Patient Spontanous Breathing and Patient connected to face mask oxygen  Post-op Assessment: Report given to RN and Post -op Vital signs reviewed and stable  Post vital signs: Reviewed and stable  Last Vitals:  Vitals Value Taken Time  BP    Temp    Pulse 51 01/13/23 1325  Resp 17 01/13/23 1325  SpO2 99 % 01/13/23 1325  Vitals shown include unfiled device data.  Last Pain:  Vitals:   01/13/23 1152  TempSrc: Tympanic  PainSc: 0-No pain         Complications: No notable events documented.

## 2023-01-13 NOTE — H&P (Signed)
GASTROENTEROLOGY PROCEDURE H&P NOTE   Primary Care Physician: Sandre Kitty, PA-C  HPI: Judith Kelly is a 52 y.o. female who presents for Colonoscopy for screening.  Past Medical History:  Diagnosis Date   ALLERGIC RHINITIS 10/04/2008   Allergy    FIBRILLATION, ATRIAL 04/05/2007   Annotation: 06/25/04, transient atrial fibrillation secondary to  hyperthyroidism; resolved  Qualifier: History of  By: Sharen Hones  MD, Javier     Osteoporosis    Rosacea 10/15/2006   Qualifier: Diagnosis of  By: Barbaraann Barthel MD, Benetta Spar     Sleep apnea    Thyroid disease    Past Surgical History:  Procedure Laterality Date   Stomach reduction  2002   Current Facility-Administered Medications  Medication Dose Route Frequency Provider Last Rate Last Admin   lactated ringers infusion   Intravenous Continuous Mansouraty, Netty Starring., MD 10 mL/hr at 01/13/23 1201 New Bag at 01/13/23 1201    Current Facility-Administered Medications:    lactated ringers infusion, , Intravenous, Continuous, Mansouraty, Netty Starring., MD, Last Rate: 10 mL/hr at 01/13/23 1201, New Bag at 01/13/23 1201 No Known Allergies Family History  Problem Relation Age of Onset   Stomach cancer Maternal Uncle    Colon cancer Neg Hx    Colon polyps Neg Hx    Esophageal cancer Neg Hx    Rectal cancer Neg Hx    Social History   Socioeconomic History   Marital status: Married    Spouse name: Not on file   Number of children: Not on file   Years of education: Not on file   Highest education level: Not on file  Occupational History   Not on file  Tobacco Use   Smoking status: Never   Smokeless tobacco: Never  Substance and Sexual Activity   Alcohol use: No   Drug use: Never   Sexual activity: Not on file  Other Topics Concern   Not on file  Social History Narrative   Not on file   Social Determinants of Health   Financial Resource Strain: Low Risk  (05/30/2021)   Received from [facility], [facility]    Overall Financial Resource Strain (CARDIA)    Difficulty of Paying Living Expenses: Not very hard  Food Insecurity: No Food Insecurity (06/22/2021)   Received from [facility], [facility]   Hunger Vital Sign    Worried About Running Out of Food in the Last Year: Never true    Ran Out of Food in the Last Year: Never true  Transportation Needs: No Transportation Needs (05/08/2021)   Received from [facility], [facility]   PRAPARE - Transportation    Lack of Transportation (Medical): No    Lack of Transportation (Non-Medical): No  Physical Activity: Sufficiently Active (05/30/2021)   Received from [facility], [facility]   Exercise Vital Sign    Days of Exercise per Week: 3 days    Minutes of Exercise per Session: 60 min  Stress: No Stress Concern Present (05/04/2021)   Received from [facility], [facility] - Occupational Stress Questionnaire    Feeling of Stress : Not at all  Social Connections: Unknown (10/27/2021)   Received from [facility], [facility]   Social Network    Social Network: Not on file  Intimate Partner Violence: Unknown (09/18/2021)   Received from [facility], [facility]   HITS    Physically Hurt: Not on file    Insult or Talk Down To:  Not on file    Threaten Physical Harm: Not on file    Scream or Curse: Not on file    Physical Exam: Today's Vitals   01/13/23 1152  Pulse: (!) 53  Resp: 11  Temp: (!) 97.2 F (36.2 C)  TempSrc: Tympanic  Weight: 54 kg  Height: 4\' 9"  (1.448 m)  PainSc: 0-No pain   Body mass index is 25.75 kg/m. GEN: NAD EYE: Sclerae anicteric ENT: MMM CV: Non-tachycardic GI: Soft, NT/ND NEURO:  Alert & Oriented x 3  Lab Results: No results for input(s): "WBC", "HGB", "HCT", "PLT" in the last 72 hours. BMET No results for input(s): "NA", "K", "CL", "CO2", "GLUCOSE", "BUN", "CREATININE", "CALCIUM" in the last 72 hours. LFT No results for input(s): "PROT",  "ALBUMIN", "AST", "ALT", "ALKPHOS", "BILITOT", "BILIDIR", "IBILI" in the last 72 hours. PT/INR No results for input(s): "LABPROT", "INR" in the last 72 hours.   Impression / Plan: This is a 52 y.o.female who presents for Colonoscopy for screening.  The risks and benefits of endoscopic evaluation/treatment were discussed with the patient and/or family; these include but are not limited to the risk of perforation, infection, bleeding, missed lesions, lack of diagnosis, severe illness requiring hospitalization, as well as anesthesia and sedation related illnesses.  The patient's history has been reviewed, patient examined, no change in status, and deemed stable for procedure.  The patient and/or family is agreeable to proceed.    Corliss Parish, MD [facility] Advanced Endoscopy Office # [phone]

## 2023-01-13 NOTE — Op Note (Signed)
[facility] Patient Name: Judith Kelly Procedure Date: 01/13/2023 MRN: [id number] Attending MD: Corliss Parish , MD, [id number] Date of Birth: [birth year] CSN: [id number] Age: 52 Admit Type: Outpatient Procedure:                Colonoscopy Indications:              Screening for colorectal malignant neoplasm Providers:                Corliss Parish, MD, Fransisca Connors, Harrington Challenger, Technician Referring MD:             Phil Dopp. L[place]gan Medicines:                Monitored Anesthesia Care Complications:            No immediate complications. Estimated Blood Loss:     Estimated blood loss: none. Procedure:                Pre-Anesthesia Assessment:                           - Prior to the procedure, a History and Physical                            was performed, and patient medications and                            allergies were reviewed. The patient's tolerance of                            previous anesthesia was also reviewed. The risks                            and benefits of the procedure and the sedation                            options and risks were discussed with the patient.                            All questions were answered, and informed consent                            was obtained. Prior Anticoagulants: The patient has                            taken no anticoagulant or antiplatelet agents. ASA                            Grade Assessment: II - A patient with mild systemic                            disease. After reviewing the risks and benefits,  the patient was deemed in satisfactory condition to                            undergo the procedure.                           After obtaining informed consent, the colonoscope                            was passed under direct vision. Throughout the                            procedure, the patient's blood pressure, pulse, and                             oxygen saturations were monitored continuously. The                            CF-HQ190L ([id number]) Olympus colonoscope was                            introduced through the anus and advanced to the 3                            cm into the ileum. The colonoscopy was performed                            without difficulty. The patient tolerated the                            procedure. The quality of the bowel preparation was                            good. The terminal ileum, ileocecal valve,                            appendiceal orifice, and rectum were photographed. Scope In: 1:03:07 PM Scope Out: 1:15:07 PM Scope Withdrawal Time: 0 hours 8 minutes 26 seconds  Total Procedure Duration: 0 hours 12 minutes 0 seconds  Findings:      The digital rectal exam findings include hemorrhoids. Pertinent       negatives include no palpable rectal lesions.      The terminal ileum and ileocecal valve appeared normal.      Normal mucosa was found in the entire colon otherwise.      Non-bleeding non-thrombosed external and internal hemorrhoids were found       during retroflexion, during perianal exam and during digital exam. The       hemorrhoids were Grade II (internal hemorrhoids that prolapse but reduce       spontaneously). Impression:               - Hemorrhoids found on digital rectal exam.                           - The examined portion of the ileum was normal.                           -  Normal mucosa in the entire examined colon                            otherwise.                           - Non-bleeding non-thrombosed external and internal                            hemorrhoids. Moderate Sedation:      Not Applicable - Patient had care per Anesthesia. Recommendation:           - The patient will be observed post-procedure,                            until all discharge criteria are met.                           - Discharge patient to home.                            - Patient has a contact number available for                            emergencies. The signs and symptoms of potential                            delayed complications were discussed with the                            patient. Return to normal activities tomorrow.                            Written discharge instructions were provided to the                            patient.                           - High fiber diet.                           - Use FiberCon 1-2 tablets PO daily.                           - Continue present medications.                           - Repeat colonoscopy in 10 years for screening                            purposes.                           - The findings and recommendations were discussed  with the patient.                           - The findings and recommendations were discussed                            with the patient's family. Procedure Code(s):        --- Professional ---                           V4259, Colorectal cancer screening; colonoscopy on                            individual not meeting criteria for high risk Diagnosis Code(s):        --- Professional ---                           Z12.11, Encounter for screening for malignant                            neoplasm of colon                           K64.1, Second degree hemorrhoids CPT copyright 2022 American [employer]. All rights reserved. The codes documented in this report are preliminary and upon coder review may  be revised to meet current compliance requirements. Corliss Parish, MD 01/13/2023 1:23:53 PM Number of Addenda: 0

## 2023-01-13 NOTE — Discharge Instructions (Signed)

## 2023-01-13 NOTE — Anesthesia Procedure Notes (Addendum)
Procedure Name: MAC Date/Time: 01/13/2023 12:57 PM  Performed by: Maurene Capes, CRNAPre-anesthesia Checklist: Patient identified, Emergency Drugs available, Suction available and Patient being monitored Patient Re-evaluated:Patient Re-evaluated prior to induction Oxygen Delivery Method: Simple face mask Induction Type: IV induction Placement Confirmation: positive ETCO2 Dental Injury: Teeth and Oropharynx as per pre-operative assessment

## 2023-01-15 ENCOUNTER — Encounter (HOSPITAL_COMMUNITY): Payer: Self-pay | Admitting: Gastroenterology

## 2023-01-16 NOTE — Anesthesia Postprocedure Evaluation (Signed)
Anesthesia Post Note  Patient: Judith Kelly  Procedure(s) Performed: COLONOSCOPY WITH PROPOFOL     Patient location during evaluation: Endoscopy Anesthesia Type: MAC Level of consciousness: oriented, awake and alert and awake Pain management: pain level controlled Vital Signs Assessment: post-procedure vital signs reviewed and stable Respiratory status: spontaneous breathing, nonlabored ventilation, respiratory function stable and patient connected to nasal cannula oxygen Cardiovascular status: blood pressure returned to baseline and stable Postop Assessment: no headache, no backache and no apparent nausea or vomiting Anesthetic complications: no   No notable events documented.  Last Vitals:  Vitals:   01/13/23 1340 01/13/23 1345  BP: 114/86 115/70  Pulse: (!) 43 (!) 43  Resp: 13 12  Temp:    SpO2: 98% 98%    Last Pain:  Vitals:   01/13/23 1345  TempSrc:   PainSc: 0-No pain                 Collene Schlichter

## 2023-02-07 LAB — HM MAMMOGRAPHY

## 2023-02-07 LAB — HM DEXA SCAN

## 2023-07-05 ENCOUNTER — Encounter: Payer: Self-pay | Admitting: Emergency Medicine

## 2023-07-05 ENCOUNTER — Ambulatory Visit
Admission: EM | Admit: 2023-07-05 | Discharge: 2023-07-05 | Disposition: A | Discharge status: Home / Self Care | Payer: 59 | Attending: Family Medicine | Admitting: Family Medicine

## 2023-07-05 DIAGNOSIS — R52 Pain, unspecified: Secondary | ICD-10-CM

## 2023-07-05 DIAGNOSIS — M542 Cervicalgia: Secondary | ICD-10-CM | POA: Diagnosis not present

## 2023-07-05 MED ORDER — DOXYCYCLINE HYCLATE 100 MG PO CAPS: 100.0000 mg | ORAL_CAPSULE | Freq: Two times a day (BID) | ORAL | 0 refills | Status: DC

## 2023-07-06 ENCOUNTER — Emergency Department (HOSPITAL_BASED_OUTPATIENT_CLINIC_OR_DEPARTMENT_OTHER): Payer: 59 | Admitting: Radiology

## 2023-07-06 ENCOUNTER — Encounter (HOSPITAL_BASED_OUTPATIENT_CLINIC_OR_DEPARTMENT_OTHER): Payer: Self-pay | Admitting: Emergency Medicine

## 2023-07-06 ENCOUNTER — Encounter (HOSPITAL_COMMUNITY): Payer: Self-pay | Admitting: Gastroenterology

## 2023-07-06 ENCOUNTER — Emergency Department (HOSPITAL_BASED_OUTPATIENT_CLINIC_OR_DEPARTMENT_OTHER)
Admission: EM | Admit: 2023-07-06 | Discharge: 2023-07-06 | Disposition: A | Discharge status: Home / Self Care | Payer: 59 | Attending: Emergency Medicine | Admitting: Emergency Medicine

## 2023-07-06 DIAGNOSIS — B349 Viral infection, unspecified: Secondary | ICD-10-CM | POA: Insufficient documentation

## 2023-07-06 DIAGNOSIS — M791 Myalgia, unspecified site: Secondary | ICD-10-CM | POA: Diagnosis present

## 2023-07-06 DIAGNOSIS — R591 Generalized enlarged lymph nodes: Secondary | ICD-10-CM

## 2023-07-06 DIAGNOSIS — R590 Localized enlarged lymph nodes: Secondary | ICD-10-CM | POA: Insufficient documentation

## 2023-07-06 LAB — URINALYSIS, ROUTINE W REFLEX MICROSCOPIC
Bilirubin Urine: NEGATIVE
Glucose, UA: NEGATIVE mg/dL
Ketones, ur: NEGATIVE mg/dL
Nitrite: NEGATIVE
Protein, ur: 30 mg/dL — AB
Specific Gravity, Urine: 1.022 (ref 1.005–1.030)
pH: 6 (ref 5.0–8.0)

## 2023-07-06 LAB — COMPREHENSIVE METABOLIC PANEL
ALT: 18 U/L (ref 0–44)
AST: 17 U/L (ref 15–41)
Albumin: 3.8 g/dL (ref 3.5–5.0)
Alkaline Phosphatase: 73 U/L (ref 38–126)
Anion gap: 8 (ref 5–15)
BUN: 19 mg/dL (ref 6–20)
CO2: 25 mmol/L (ref 22–32)
Calcium: 9.1 mg/dL (ref 8.9–10.3)
Chloride: 108 mmol/L (ref 98–111)
Creatinine, Ser: 0.85 mg/dL (ref 0.44–1.00)
GFR, Estimated: >60 mL/min (ref >60)
Glucose, Bld: 63 mg/dL — ABNORMAL LOW (ref 70–99)
Potassium: 4 mmol/L (ref 3.5–5.1)
Sodium: 141 mmol/L (ref 135–145)
Total Bilirubin: 0.6 mg/dL (ref 0.0–1.2)
Total Protein: 6.8 g/dL (ref 6.5–8.1)

## 2023-07-06 LAB — T4, FREE: Free T4: 1.1 ng/dL (ref 0.61–1.12)

## 2023-07-06 LAB — CBC WITH DIFFERENTIAL/PLATELET
Abs Immature Granulocytes: 0.01 10*3/uL (ref 0.00–0.07)
Basophils Absolute: 0 10*3/uL (ref 0.0–0.1)
Basophils Relative: 0 %
Eosinophils Absolute: 0.1 10*3/uL (ref 0.0–0.5)
Eosinophils Relative: 1 %
HCT: 36.4 % (ref 36.0–46.0)
Hemoglobin: 12 g/dL (ref 12.0–15.0)
Immature Granulocytes: 0 %
Lymphocytes Relative: 17 %
Lymphs Abs: 1.1 10*3/uL (ref 0.7–4.0)
MCH: 29.8 pg (ref 26.0–34.0)
MCHC: 33 g/dL (ref 30.0–36.0)
MCV: 90.3 fL (ref 80.0–100.0)
Monocytes Absolute: 0.7 10*3/uL (ref 0.1–1.0)
Monocytes Relative: 10 %
Neutro Abs: 4.7 10*3/uL (ref 1.7–7.7)
Neutrophils Relative %: 72 %
Platelets: 179 10*3/uL (ref 150–400)
RBC: 4.03 MIL/uL (ref 3.87–5.11)
RDW: 12.8 % (ref 11.5–15.5)
WBC: 6.6 10*3/uL (ref 4.0–10.5)
nRBC: 0 % (ref 0.0–0.2)

## 2023-07-06 LAB — MONONUCLEOSIS SCREEN: Mono Screen: NEGATIVE

## 2023-07-06 LAB — TSH: TSH: 2.386 u[IU]/mL (ref 0.350–4.500)

## 2023-07-06 MED ORDER — SODIUM CHLORIDE 0.9 % IV BOLUS
1000.0000 mL | Freq: Once | INTRAVENOUS | Status: AC
  Administered 2023-07-06: 1000 mL via INTRAVENOUS

## 2023-07-06 MED ORDER — DIPHENHYDRAMINE HCL 50 MG/ML IJ SOLN
25.0000 mg | Freq: Once | INTRAMUSCULAR | Status: AC
  Administered 2023-07-06: 25 mg via INTRAVENOUS
  Filled 2023-07-06: qty 1

## 2023-07-06 MED ORDER — KETOROLAC TROMETHAMINE 15 MG/ML IJ SOLN
15.0000 mg | Freq: Once | INTRAMUSCULAR | Status: AC
  Administered 2023-07-06: 15 mg via INTRAVENOUS
  Filled 2023-07-06: qty 1

## 2023-07-06 MED ORDER — METOCLOPRAMIDE HCL 5 MG/ML IJ SOLN
10.0000 mg | Freq: Once | INTRAMUSCULAR | Status: AC
  Administered 2023-07-06: 10 mg via INTRAVENOUS
  Filled 2023-07-06: qty 2

## 2023-07-06 NOTE — Discharge Instructions (Signed)
Your blood work, and chest x-ray are reassuring.  There is no evidence of infection.  However given your symptoms I believe you likely have a viral infection.  Please follow-up with your primary care doctor, for further evaluation.  If you do not have a primary care doctor, call the number below.  Additionally make sure you are drinking lots of fluids, and resting

## 2023-07-06 NOTE — ED Triage Notes (Signed)
Pt c/o "feeling bad" on Sunday. Was tx at [facility] on 1/21. Pt c/o posterior neck pain with chills and sore throat. Tested for Flu and covid, testing was neg.

## 2023-07-06 NOTE — ED Provider Notes (Signed)
[place] EMERGENCY DEPARTMENT AT [facility] Provider Note   CSN: [id number] Arrival date & time: 07/06/23  1740     History  Chief Complaint  Patient presents with   Neck Pain    Judith Kelly is a 53 y.o. female, history of A-fib, thyroid disorder, who presents to the ED secondary to enlarged lymph nodes, of the anterior neck, as well as body aches, that been going on for the last 4 days.  She states she has been ill for the last 4 days, felt achy all over, especially her head, neck, and back.  Denies any urinary symptoms, frequency, urgency.  Denies any chest pain, shortness of breath, sore throat.  She reports that it feels like her throat is swollen however in the front.  States she went to urgent care yesterday and was tested for COVID, and flu, and they were negative.     Home Medications Prior to Admission medications   Medication Sig Start Date End Date Taking? Authorizing Provider  acetaminophen (TYLENOL) 325 MG tablet Take 650 mg by mouth every 6 (six) hours as needed for fever.     [provider]  albuterol (VENTOLIN HFA) 108 (90 Base) MCG/ACT inhaler Inhale 2 puffs into the lungs every 6 (six) hours as needed for wheezing or shortness of breath. Patient not taking: Reported on 01/13/2023 02/05/19   Maretta Bees, MD  calcium carbonate (OSCAL) 1500 (600 Ca) MG TABS tablet Take by mouth 2 (two) times daily with a meal.    [provider]  doxycycline (VIBRAMYCIN) 100 MG capsule Take 1 capsule (100 mg total) by mouth 2 (two) times daily. 07/05/23   Mardella Layman, MD  hydrochlorothiazide (HYDRODIURIL) 25 MG tablet Take 1 tablet (25 mg total) by mouth daily. Patient not taking: Reported on 10/28/2022 12/23/15   Henrietta Hoover, NP  lisinopril (PRINIVIL,ZESTRIL) 5 MG tablet Take 1 tablet (5 mg total) by mouth daily. Patient not taking: Reported on 10/28/2022 12/23/15   Henrietta Hoover, NP  loratadine (CLARITIN) 10 MG tablet Take 10 mg by  mouth daily. Patient not taking: Reported on 10/28/2022 12/15/18   [provider]  MAGNESIUM PO Take by mouth.    [provider]  metFORMIN (GLUCOPHAGE) 500 MG tablet Take 1 tablet (500 mg total) by mouth daily with breakfast. Patient not taking: Reported on 10/28/2022 08/20/15   Massie Maroon, FNP  Multiple Vitamin tablet Take 1 tablet by mouth daily.    [provider]  Omega-3 Fatty Acids (FISH OIL) 300 MG CAPS Fish Oil  1 tablet daily    [provider]  predniSONE (DELTASONE) 10 MG tablet Take 40 mg daily for 1 day, 30 mg daily for 1 day, 20 mg daily for 1 days,10 mg daily for 1 day, then stop Patient not taking: Reported on 10/28/2022 02/05/19   Maretta Bees, MD  SYNTHROID 125 MCG tablet Take 125 mcg by mouth every morning. 12/23/18   [provider]      Allergies    Patient has no known allergies.    Review of Systems   Review of Systems  Constitutional:  Positive for chills. Negative for fever.  Musculoskeletal:  Positive for neck pain.    Physical Exam Updated Vital Signs BP 113/76   Pulse 69   Temp 98.4 F (36.9 C)   Resp 16   Wt 55.8 kg   LMP 12/17/2015   SpO2 100%   BMI 26.62 kg/m  Physical Exam  Vitals and nursing note reviewed.  Constitutional:      General: She is not in acute distress.    Appearance: She is well-developed.  HENT:     Head: Normocephalic and atraumatic.     Right Ear: Tympanic membrane normal.     Left Ear: Tympanic membrane normal.     Mouth/Throat:     Mouth: Mucous membranes are moist.     Pharynx: No pharyngeal swelling, oropharyngeal exudate or posterior oropharyngeal erythema.     Tonsils: No tonsillar exudate or tonsillar abscesses. 1+ on the right. 1+ on the left.  Eyes:     Conjunctiva/sclera: Conjunctivae normal.  Neck:     Comments: +bilateral anterior lymphadenopathy, no nuchal rigidity Cardiovascular:     Rate and Rhythm: Normal rate and regular rhythm.     Heart sounds:  No murmur heard. Pulmonary:     Effort: Pulmonary effort is normal. No respiratory distress.     Breath sounds: Normal breath sounds.  Abdominal:     Palpations: Abdomen is soft.     Tenderness: There is no abdominal tenderness.  Musculoskeletal:        General: No swelling.     Cervical back: Neck supple.  Skin:    General: Skin is warm and dry.     Capillary Refill: Capillary refill takes less than 2 seconds.  Neurological:     Mental Status: She is alert.  Psychiatric:        Mood and Affect: Mood normal.     ED Results / Procedures / Treatments   Labs (all labs ordered are listed, but only abnormal results are displayed) Labs Reviewed  COMPREHENSIVE METABOLIC PANEL - Abnormal; Notable for the following components:      Result Value   Glucose, Bld 63 (*)    All other components within normal limits  URINALYSIS, ROUTINE W REFLEX MICROSCOPIC - Abnormal; Notable for the following components:   Hgb urine dipstick MODERATE (*)    Protein, ur 30 (*)    Leukocytes,Ua Artha Chiasson (*)    Bacteria, UA RARE (*)    All other components within normal limits  CBC WITH DIFFERENTIAL/PLATELET  MONONUCLEOSIS SCREEN  TSH  T4, FREE    EKG None  Radiology DG Chest 2 View Result Date: 07/06/2023 CLINICAL DATA:  Cervical adenopathy EXAM: CHEST - 2 VIEW COMPARISON:  01/31/2019 FINDINGS: The heart size and mediastinal contours are within normal limits. Both lungs are clear. The visualized skeletal structures are unremarkable. IMPRESSION: Normal study. Electronically Signed   By: Charlett Nose M.D.   On: 07/06/2023 19:21    Procedures Procedures    Medications Ordered in ED Medications  ketorolac (TORADOL) 15 MG/ML injection 15 mg (15 mg Intravenous Given 07/06/23 1941)  sodium chloride 0.9 % bolus 1,000 mL (1,000 mLs Intravenous New Bag/Given 07/06/23 1941)  diphenhydrAMINE (BENADRYL) injection 25 mg (25 mg Intravenous Given 07/06/23 1942)  metoCLOPramide (REGLAN) injection 10 mg (10 mg  Intravenous Given 07/06/23 1945)    ED Course/ Medical Decision Making/ A&P                                 Medical Decision Making Patient is a 53 year old female, who presents to the ED secondary to body aches, enlarged lymph nodes for the last 4 days.  Is not having a cough, sore throat, urinary symptoms.  Complains of bodyaches all over, especially to the neck, and the head.  She  is overall well-appearing has anterior cervical lymphadenopathy, tested negative for COVID/flu, and strep at urgent care.  Was started on doxycycline.  Will obtain mono, CBC CMP, TSH given her history of thyroid issues, urinalysis and chest x-ray to rule out any kind of infectious symptoms.  Will give headache cocktail, for symptoms.  She has no nuchal rigidity, no altered mental status, and no vision changes  Amount and/or Complexity of Data Reviewed Labs: ordered.    Details: Unremarkable Radiology: ordered.    Details: Chest x-ray clear Discussion of management or test interpretation with external provider(s): Discussed with patient, blood work is reassuring, chest x-ray is clear.  She is mono negative.  She is feeling better after the headache cocktail, and is overall well-appearing.  She has no nuchal rigidity, no rashes.  Instructed to follow-up with PCP, and return if symptoms worsen.  Risk Prescription drug management.    Final Clinical Impression(s) / ED Diagnoses Final diagnoses:  Lymphadenopathy  Viral illness    Rx / DC Orders ED Discharge Orders     None         Keedan Sample, Harley Alto, PA 07/06/23 2045    Royanne Foots, DO 07/11/23 1145

## 2023-07-07 NOTE — ED Provider Notes (Signed)
[facility]   [id number] 07/05/23 Arrival Time: 1037  ASSESSMENT & PLAN:  1. Neck pain on left side   2. Body aches    Discussed typical duration of likely viral illness. Results for orders placed or performed during the hospital encounter of 07/05/23  POC Covid19/Flu A&B Antigen   Collection Time: 07/05/23  2:23 PM  Result Value Ref Range   Influenza A Antigen, POC Negative    Influenza B Antigen, POC Negative    Covid Antigen, POC Negative   POCT rapid strep A   Collection Time: 07/05/23  2:41 PM  Result Value Ref Range   Rapid Strep A Screen Negative    OTC symptom care as needed.     Follow-up Information     [place] [facility].   Specialty: Emergency Medicine Why: If symptoms worsen in any way. Contact information: [address] [place] [address] [phone]                Reviewed expectations re: course of current medical issues. Questions answered. Outlined signs and symptoms indicating need for more acute intervention. Understanding verbalized. After Visit Summary given.   SUBJECTIVE: History from: Patient. Judith Kelly is a 53 y.o. female. Pt here for body aches and fever x 3 days; pt sts some sore throat Denies resp symptoms. ormal PO intake without n/v/d.  OBJECTIVE:  Vitals:   07/05/23 1402  BP: 108/73  Pulse: 85  Resp: 18  Temp: 100.3 F (37.9 C)  TempSrc: Oral  SpO2: 97%    General appearance: alert; no distress Eyes: PERRLA; EOMI; conjunctiva normal HENT: [place]; AT; with mild nasal congestion; throat with cobblestoning Neck: supple wim small bilat cerv LAD Lungs: speaks full sentences without difficulty; unlabored Extremities: no edema Skin: warm and dry Neurologic: normal gait Psychological: alert and cooperative; normal mood and affect  Labs: Results for orders placed or performed during the hospital encounter of 07/05/23  POC Covid19/Flu A&B  Antigen   Collection Time: 07/05/23  2:23 PM  Result Value Ref Range   Influenza A Antigen, POC Negative    Influenza B Antigen, POC Negative    Covid Antigen, POC Negative   POCT rapid strep A   Collection Time: 07/05/23  2:41 PM  Result Value Ref Range   Rapid Strep A Screen Negative    Labs Reviewed  POC COVID19/FLU A&B COMBO - Normal  POCT RAPID STREP A (OFFICE) - Normal     No Known Allergies  Past Medical History:  Diagnosis Date   ALLERGIC RHINITIS 10/04/2008   Allergy    FIBRILLATION, ATRIAL 04/05/2007   Annotation: 06/25/04, transient atrial fibrillation secondary to  hyperthyroidism; resolved  Qualifier: History of  By: Sharen Hones  MD, Javier     Osteoporosis    Rosacea 10/15/2006   Qualifier: Diagnosis of  By: Barbaraann Barthel MD, [name]     Sleep apnea    Thyroid disease    Social History   Socioeconomic History   Marital status: Married    Spouse name: Not on file   Number of children: Not on file   Years of education: Not on file   Highest education level: Not on file  Occupational History   Not on file  Tobacco Use   Smoking status: Never   Smokeless tobacco: Never  Substance and Sexual Activity   Alcohol use: Never   Drug use: Never   Sexual activity: Not on file  Other Topics Concern  Not on file  Social History Narrative   ** Merged History Encounter **       Social Drivers of Health   Financial Resource Strain: Low Risk  (05/30/2021)   Received from [facility], [facility]   Overall Financial Resource Strain (CARDIA)    Difficulty of Paying Living Expenses: Not very hard  Food Insecurity: No Food Insecurity (06/22/2021)   Received from [facility], [facility]   Hunger Vital Sign    Worried About Running Out of Food in the Last Year: Never true    Ran Out of Food in the Last Year: Never true  Transportation Needs: No Transportation Needs (05/08/2021)   Received from [facility], [facility]   PRAPARE - Transportation    Lack of  Transportation (Medical): No    Lack of Transportation (Non-Medical): No  Physical Activity: Sufficiently Active (05/30/2021)   Received from [facility], [facility]   Exercise Vital Sign    Days of Exercise per Week: 3 days    Minutes of Exercise per Session: 60 min  Stress: No Stress Concern Present (05/04/2021)   Received from [facility], [facility] - Occupational Stress Questionnaire    Feeling of Stress : Not at all  Social Connections: Unknown (10/27/2021)   Received from [facility], [facility]   Social Network    Social Network: Not on file  Intimate Partner Violence: Unknown (05/23/2023)   Received from [facility]   HITS    Over the last 12 months how often did your partner physically hurt you?: Never    Insult or Talk Down To: Not on file    Threaten Physical Harm: Not on file    Scream or Curse: Not on file   Family History  Problem Relation Age of Onset   Stomach cancer Maternal Uncle    Colon cancer Neg Hx    Colon polyps Neg Hx    Esophageal cancer Neg Hx    Rectal cancer Neg Hx    Past Surgical History:  Procedure Laterality Date   COLONOSCOPY WITH PROPOFOL N/A 01/13/2023   Procedure: COLONOSCOPY WITH PROPOFOL;  Surgeon: Lemar Lofty., MD;  Location: [facility];  Service: Gastroenterology;  Laterality: N/A;   Stomach reduction  2002     Mardella Layman, MD 07/07/23 1358

## 2023-08-10 ENCOUNTER — Ambulatory Visit: Payer: 59 | Admitting: Family Medicine

## 2023-08-10 ENCOUNTER — Encounter: Payer: Self-pay | Admitting: Family Medicine

## 2023-08-10 VITALS — BP 116/80 | HR 60 | Temp 97.5°F | Ht 58.9 in | Wt 122.4 lb

## 2023-08-10 DIAGNOSIS — I10 Essential (primary) hypertension: Secondary | ICD-10-CM

## 2023-08-10 DIAGNOSIS — E018 Other iodine-deficiency related thyroid disorders and allied conditions: Secondary | ICD-10-CM

## 2023-08-10 DIAGNOSIS — Z23 Encounter for immunization: Secondary | ICD-10-CM

## 2023-08-10 DIAGNOSIS — Z136 Encounter for screening for cardiovascular disorders: Secondary | ICD-10-CM

## 2023-08-10 DIAGNOSIS — M8580 Other specified disorders of bone density and structure, unspecified site: Secondary | ICD-10-CM

## 2023-08-10 DIAGNOSIS — Z13220 Encounter for screening for lipoid disorders: Secondary | ICD-10-CM | POA: Diagnosis not present

## 2023-08-10 DIAGNOSIS — R7303 Prediabetes: Secondary | ICD-10-CM

## 2023-08-10 DIAGNOSIS — Z124 Encounter for screening for malignant neoplasm of cervix: Secondary | ICD-10-CM

## 2023-08-10 DIAGNOSIS — Z1159 Encounter for screening for other viral diseases: Secondary | ICD-10-CM

## 2023-08-10 DIAGNOSIS — Z7689 Persons encountering health services in other specified circumstances: Secondary | ICD-10-CM

## 2023-08-10 DIAGNOSIS — G4730 Sleep apnea, unspecified: Secondary | ICD-10-CM | POA: Diagnosis not present

## 2023-08-10 LAB — LIPID PANEL
Cholesterol: 157 mg/dL (ref 0–200)
HDL: 70.7 mg/dL (ref >39.00)
LDL Cholesterol: 73 mg/dL (ref 0–99)
NonHDL: 85.8
Total CHOL/HDL Ratio: 2
Triglycerides: 66 mg/dL (ref 0.0–149.0)
VLDL: 13.2 mg/dL (ref 0.0–40.0)

## 2023-08-10 LAB — CBC WITH DIFFERENTIAL/PLATELET
Basophils Absolute: 0 10*3/uL (ref 0.0–0.1)
Basophils Relative: 1.1 % (ref 0.0–3.0)
Eosinophils Absolute: 0.3 10*3/uL (ref 0.0–0.7)
Eosinophils Relative: 7 % — ABNORMAL HIGH (ref 0.0–5.0)
HCT: 39 % (ref 36.0–46.0)
Hemoglobin: 12.9 g/dL (ref 12.0–15.0)
Lymphocytes Relative: 43.9 % (ref 12.0–46.0)
Lymphs Abs: 2 10*3/uL (ref 0.7–4.0)
MCHC: 32.9 g/dL (ref 30.0–36.0)
MCV: 91.4 fL (ref 78.0–100.0)
Monocytes Absolute: 0.4 10*3/uL (ref 0.1–1.0)
Monocytes Relative: 8.9 % (ref 3.0–12.0)
Neutro Abs: 1.8 10*3/uL (ref 1.4–7.7)
Neutrophils Relative %: 39.1 % — ABNORMAL LOW (ref 43.0–77.0)
Platelets: 229 10*3/uL (ref 150.0–400.0)
RBC: 4.27 Mil/uL (ref 3.87–5.11)
RDW: 14.1 % (ref 11.5–15.5)
WBC: 4.6 10*3/uL (ref 4.0–10.5)

## 2023-08-10 LAB — COMPREHENSIVE METABOLIC PANEL
ALT: 30 U/L (ref 0–35)
AST: 34 U/L (ref 0–37)
Albumin: 4.1 g/dL (ref 3.5–5.2)
Alkaline Phosphatase: 83 U/L (ref 39–117)
BUN: 17 mg/dL (ref 6–23)
CO2: 28 meq/L (ref 19–32)
Calcium: 9.3 mg/dL (ref 8.4–10.5)
Chloride: 102 meq/L (ref 96–112)
Creatinine, Ser: 0.77 mg/dL (ref 0.40–1.20)
GFR: 87.86 mL/min (ref >60.00)
Glucose, Bld: 79 mg/dL (ref 70–99)
Potassium: 3.9 meq/L (ref 3.5–5.1)
Sodium: 139 meq/L (ref 135–145)
Total Bilirubin: 0.5 mg/dL (ref 0.2–1.2)
Total Protein: 7.4 g/dL (ref 6.0–8.3)

## 2023-08-10 LAB — HEMOGLOBIN A1C: Hgb A1c MFr Bld: 5.2 % (ref 4.6–6.5)

## 2023-08-10 LAB — TSH: TSH: 1.04 u[IU]/mL (ref 0.35–5.50)

## 2023-08-10 NOTE — Assessment & Plan Note (Signed)
 Stable. Continue Levothyroxine daily. Ordered TSH.

## 2023-08-10 NOTE — Patient Instructions (Addendum)
-  It was nice to meet you and look forward to taking care of you.  -Ordered labs. Office will call with lab results and you will see them on MyChart.  -Continue all medications.  -Placed a referral to GYN and for a sleep study. Please call the office or send a MyChart in 2 weeks, if you have not received a phone call or MyChart message about an appointment. -Tdap vaccine provided today.  -Follow up in 6 months for a physical.

## 2023-08-10 NOTE — Progress Notes (Signed)
 New Patient Office Visit  Subjective   Patient ID: Judith Kelly, female    DOB: 07/04/1970  Age: 53 y.o. MRN: [id number]  CC:  Chief Complaint  Patient presents with   Establish Care    HPI Judith Kelly presents to establish care with new provider.   Patients previous primary care provider was [facility] PA-C. Last visit was months ago.   Specialist: [facility] in [place], NP   Hypothyroidism: Chronic. Patient is taking Levothyroxine daily.  Bowel habit changes - No Heat or cold intolerance - No Mood changes - No Changes in sleep habits - No Fatigue - No Skin, hair, or nail changes - No Tremor - No Palpitations - No Edema - No Shortness of breath - No  Patient is wanting a referral for another sleep apnea study. She currently has sleep apnea and using C-Pap. She reports she has used it for about 2 years.   Outpatient Encounter Medications as of 08/10/2023  Medication Sig   acetaminophen (TYLENOL) 325 MG tablet Take 650 mg by mouth every 6 (six) hours as needed for fever.    acyclovir (ZOVIRAX) 400 MG tablet Take 1 tablet by mouth 2 (two) times daily.   calcium carbonate (OSCAL) 1500 (600 Ca) MG TABS tablet Take by mouth 2 (two) times daily with a meal.   loratadine (CLARITIN) 10 MG tablet Take 10 mg by mouth daily.   MAGNESIUM PO Take by mouth.   Multiple Vitamin tablet Take 1 tablet by mouth daily.   Omega-3 Fatty Acids (FISH OIL) 300 MG CAPS Fish Oil  1 tablet daily   SYNTHROID 125 MCG tablet Take 125 mcg by mouth every morning.   [DISCONTINUED] albuterol (VENTOLIN HFA) 108 (90 Base) MCG/ACT inhaler Inhale 2 puffs into the lungs every 6 (six) hours as needed for wheezing or shortness of breath. (Patient not taking: Reported on 08/10/2023)   [DISCONTINUED] doxycycline (VIBRAMYCIN) 100 MG capsule Take 1 capsule (100 mg total) by mouth 2 (two) times daily.   [DISCONTINUED]  hydrochlorothiazide (HYDRODIURIL) 25 MG tablet Take 1 tablet (25 mg total) by mouth daily. (Patient not taking: Reported on 10/28/2022)   [DISCONTINUED] lisinopril (PRINIVIL,ZESTRIL) 5 MG tablet Take 1 tablet (5 mg total) by mouth daily. (Patient not taking: Reported on 10/28/2022)   [DISCONTINUED] metFORMIN (GLUCOPHAGE) 500 MG tablet Take 1 tablet (500 mg total) by mouth daily with breakfast. (Patient not taking: Reported on 10/28/2022)   [DISCONTINUED] predniSONE (DELTASONE) 10 MG tablet Take 40 mg daily for 1 day, 30 mg daily for 1 day, 20 mg daily for 1 days,10 mg daily for 1 day, then stop (Patient not taking: Reported on 08/10/2023)   No facility-administered encounter medications on file as of 08/10/2023.    Past Medical History:  Diagnosis Date   ALLERGIC RHINITIS 10/04/2008   Allergy    Diabetes (HCC)    FIBRILLATION, ATRIAL 04/05/2007   Annotation: 06/25/04, transient atrial fibrillation secondary to  hyperthyroidism; resolved  Qualifier: History of  By: Sharen Hones  MD, Wynona Canes     Herpes genitalia    Osteoporosis    Rosacea 10/15/2006   Qualifier: Diagnosis of  By: Barbaraann Barthel MD, [name]     Sleep apnea    Thyroid disease     Past Surgical History:  Procedure Laterality Date   COLONOSCOPY WITH PROPOFOL N/A 01/13/2023   Procedure: COLONOSCOPY WITH PROPOFOL;  Surgeon: Lemar Lofty., MD;  Location: [facility];  Service: Gastroenterology;  Laterality:  N/A;   LAPAROSCOPIC GASTRIC BAND REMOVAL WITH LAPAROSCOPIC GASTRIC SLEEVE RESECTION     TUBAL LIGATION      Family History  Problem Relation Age of Onset   Cerebral aneurysm Mother    Kidney disease Father    Hyperlipidemia Sister    Hypertension Sister    Hyperthyroidism Sister    Hypertension Sister    Heart attack Brother    Hypothyroidism Daughter    Stomach cancer Maternal Uncle    Heart attack Maternal Grandmother    Colon cancer Neg Hx    Colon polyps Neg Hx    Esophageal cancer Neg Hx    Rectal cancer Neg  Hx     Social History   Socioeconomic History   Marital status: Married    Spouse name: Not on file   Number of children: 3   Years of education: Not on file   Highest education level: 8th grade  Occupational History   Not on file  Tobacco Use   Smoking status: Never   Smokeless tobacco: Never  Vaping Use   Vaping status: Never Used  Substance and Sexual Activity   Alcohol use: Never   Drug use: Never   Sexual activity: Yes    Birth control/protection: Surgical  Other Topics Concern   Not on file  Social History Narrative   ** Merged History Encounter **       Social Drivers of Health   Financial Resource Strain: Low Risk  (05/30/2021)   Received from [employer] [employer], [facility]   Overall Financial Resource Strain (CARDIA)    Difficulty of Paying Living Expenses: Not very hard  Food Insecurity: No Food Insecurity (06/22/2021)   Received from [facility], [facility]   Hunger Vital Sign    Worried About Running Out of Food in the Last Year: Never true    Ran Out of Food in the Last Year: Never true  Transportation Needs: No Transportation Needs (05/08/2021)   Received from [facility], [facility]   PRAPARE - Transportation    Lack of Transportation (Medical): No    Lack of Transportation (Non-Medical): No  Physical Activity: Sufficiently Active (08/10/2023)   Exercise Vital Sign    Days of Exercise per Week: 5 days    Minutes of Exercise per Session: 60 min  Stress: No Stress Concern Present (08/10/2023)   [employer] of Occupational Health - Occupational Stress Questionnaire    Feeling of Stress : Only a little  Social Connections: Unknown (10/27/2021)   Received from [facility], [facility]   Social Network    Social Network: Not on file  Intimate Partner Violence: Unknown (05/23/2023)   Received from [facility]   HITS    Over the last 12 months how often did your partner physically hurt you?: Never    Insult or Talk Down To: Not on file     Threaten Physical Harm: Not on file    Scream or Curse: Not on file    ROS See HPI above    Objective  BP 116/80 (BP Location: Left Arm, Patient Position: Sitting, Cuff Size: Normal)   Pulse 60   Temp (!) 97.5 F (36.4 C) (Oral)   Ht 4' 10.9" (1.496 m)   Wt 122 lb 6.4 oz (55.5 kg)   LMP 12/17/2015   SpO2 98%   BMI 24.81 kg/m   Physical Exam Vitals reviewed.  Constitutional:      General: She is not in acute distress.  Appearance: Normal appearance. She is not ill-appearing, toxic-appearing or diaphoretic.  HENT:     Head: Normocephalic and atraumatic.  Eyes:     General:        Right eye: No discharge.        Left eye: No discharge.     Conjunctiva/sclera: Conjunctivae normal.  Cardiovascular:     Rate and Rhythm: Normal rate.  Pulmonary:     Effort: Pulmonary effort is normal. No respiratory distress.  Musculoskeletal:        General: Normal range of motion.  Skin:    General: Skin is warm and dry.  Neurological:     General: No focal deficit present.     Mental Status: She is alert and oriented to person, place, and time. Mental status is at baseline.  Psychiatric:        Mood and Affect: Mood normal.        Behavior: Behavior normal.        Thought Content: Thought content normal.        Judgment: Judgment normal.      Assessment & Plan:  HYPOTHYROIDISM, POST-RADIOACTIVE IODINE Assessment & Plan: Stable. Continue Levothyroxine daily. Ordered TSH.   Orders: -     TSH  Sleep apnea with use of continuous positive airway pressure (CPAP) -     Ambulatory referral to Sleep Studies  Prediabetes -     Hemoglobin A1c  Essential hypertension, benign -     CBC with Differential/Platelet -     Comprehensive metabolic panel  Cervical cancer screening -     Ambulatory referral to Gynecology  Need for hepatitis C screening test -     Hepatitis C antibody  Encounter for lipid screening for cardiovascular disease -     Lipid panel  Need for  tetanus booster -     Tdap vaccine greater than or equal to 7yo IM  Encounter to establish care   1.Review health maintenance:  -Cervical Cancer Screening: 2024 with previous PCP; request records; Placed a referral to GYN to establish care -Tdap: Ordered and administered  -Mammogram: [facility]  -PNA vaccine: [facility]; request records -Covid booster: Declines  -Influenza vaccine: Declines  -Ordered Hep C screening 2.Placed a referral to sleep studies for a sleep apnea test since she has sleep apnea and using a C-pap machine.  3.Ordered labs (CBC, CMP, Lipid Panel, and A1c) based on screening labs and past medical history of diabetes and hypertension.  Return in about 6 months (around 02/07/2024) for physical.   Zandra Abts, NP

## 2023-08-11 ENCOUNTER — Encounter: Payer: Self-pay | Admitting: Family Medicine

## 2023-08-11 DIAGNOSIS — M8580 Other specified disorders of bone density and structure, unspecified site: Secondary | ICD-10-CM | POA: Insufficient documentation

## 2023-08-11 DIAGNOSIS — M810 Age-related osteoporosis without current pathological fracture: Secondary | ICD-10-CM | POA: Insufficient documentation

## 2023-08-11 LAB — HEPATITIS C ANTIBODY: Hepatitis C Ab: NONREACTIVE

## 2023-08-11 LAB — HIV ANTIBODY (ROUTINE TESTING W REFLEX): HIV 1&2 Ab, 4th Generation: NONREACTIVE

## 2023-10-03 ENCOUNTER — Ambulatory Visit (INDEPENDENT_AMBULATORY_CARE_PROVIDER_SITE_OTHER): Payer: Self-pay | Admitting: Obstetrics and Gynecology

## 2023-10-03 ENCOUNTER — Other Ambulatory Visit (HOSPITAL_COMMUNITY)
Admission: RE | Admit: 2023-10-03 | Discharge: 2023-10-03 | Disposition: A | Discharge status: Home / Self Care | Source: Ambulatory Visit | Attending: Obstetrics and Gynecology | Admitting: Obstetrics and Gynecology

## 2023-10-03 ENCOUNTER — Encounter: Payer: Self-pay | Admitting: Obstetrics and Gynecology

## 2023-10-03 VITALS — BP 120/64 | HR 53 | Temp 97.7°F | Ht 59.75 in | Wt 124.0 lb

## 2023-10-03 DIAGNOSIS — R6882 Decreased libido: Secondary | ICD-10-CM

## 2023-10-03 DIAGNOSIS — N958 Other specified menopausal and perimenopausal disorders: Secondary | ICD-10-CM | POA: Diagnosis not present

## 2023-10-03 DIAGNOSIS — Z1331 Encounter for screening for depression: Secondary | ICD-10-CM

## 2023-10-03 DIAGNOSIS — Z124 Encounter for screening for malignant neoplasm of cervix: Secondary | ICD-10-CM

## 2023-10-03 DIAGNOSIS — Z01419 Encounter for gynecological examination (general) (routine) without abnormal findings: Secondary | ICD-10-CM | POA: Diagnosis not present

## 2023-10-03 MED ORDER — ESTRADIOL 0.1 MG/GM VA CREA: TOPICAL_CREAM | VAGINAL | 1 refills | Status: DC

## 2023-10-03 MED ORDER — TESTOSTERONE 12.5 MG/ACT (1%) TD GEL: TRANSDERMAL | 3 refills | Status: DC

## 2023-10-03 NOTE — Assessment & Plan Note (Signed)
 Cervical cancer screening performed according to ASCCP guidelines. Encouraged annual mammogram screening Colonoscopy UTD DXA UTD Labs and immunizations with her primary Encouraged safe sexual practices as indicated Encouraged healthy lifestyle practices with diet and exercise For patients under 50-54yo, I recommend 1200mg  calcium daily and 600IU of vitamin D daily.

## 2023-10-03 NOTE — Patient Instructions (Signed)

## 2023-10-03 NOTE — Assessment & Plan Note (Signed)
 Also discussed multifactorial etiologies for low libido. Consider testosterone  testing. Encouraged healthy lifestyle and intentionality around physical intimacy.   Medical therapies include testosterone  placement and wellbutrin.  The patient is aware that testosterone  is not an FDA approved medication for low libido in women, and there is limited safety and efficacy data.  Testosterone  can be used with or without estrogen and progesterone. Contraindications include cardiovascular disease, hepatic disease, endometrial hyperplasia or cancer, or breast cancer. Adverse effects include mild hirsutism and acne, changes in cholesterol, and breast cancer. Standard dosing is 300 mcg of testosterone  applied topically daily. Will send in rx for 1% testosterone  cream, apply pea-sized amount to skin daily (0.5g).  1 refill provided to [facility] called by MA. Will check testosterone  today.  Normal CBC, CMP, lipids, TSH on file. Plan for blood pressure, testosterone , lipid, LFT, CBC monitoring at 3-6 months and then annually.  All questions answered.

## 2023-10-03 NOTE — Progress Notes (Addendum)
 53 y.o. [id number] postmenopausal female with osteopenia here for annual exam. Married. Works in Education officer, environmental. Due to language barrier, an interpreter was present during the history-taking and subsequent discussion (and for part of the physical exam) with this patient.  Patient's last menstrual period was 12/17/2015.   Vaginal itching, used probiotics and it has since went away.  Right side of labia, has a bump that she is concerned with.  Has been there for years. Reports low libido with husband.  Curious about treatment options.  Abnormal bleeding: None Pelvic discharge or pain: None Breast mass, nipple discharge or skin changes : None Last PAP: No results found for: "DIAGPAP", "HPVHIGH", "ADEQPAP" Last mammogram: 02/07/2023 BI-RADS 1, density A Last colonoscopy: 01/13/23  DEXA: 02/07/2023 osteopenia, normal FRAX Sexually active: yes  Exercising: Gym/ health club routine includes zumba and strength training.  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 [facility]  PHQ-2 Total Score 0        GYN HISTORY: No significant history  OB History  Gravida Para Term Preterm AB Living  3 3 3   3   SAB IAB Ectopic Multiple Live Births          # Outcome Date GA Lbr Len/2nd Weight Sex Type Anes PTL Lv  3 Term           2 Term           1 Term             Past Medical History:  Diagnosis Date   ALLERGIC RHINITIS 10/04/2008   Allergy    Diabetes (HCC)    FIBRILLATION, ATRIAL 04/05/2007   Annotation: 06/25/04, transient atrial fibrillation secondary to  hyperthyroidism; resolved  Qualifier: History of  By: [name]  MD, [name]     Herpes genitalia    Osteopenia    Rosacea 10/15/2006   Qualifier: Diagnosis of  By: [name] MD, [name]     Sleep apnea    Thyroid  disease     Past Surgical History:  Procedure Laterality Date   COLONOSCOPY WITH PROPOFOL  N/A 01/13/2023   Procedure: COLONOSCOPY WITH PROPOFOL ;  Surgeon: Mansouraty, Albino Alu., MD;  Location: [facility];  Service: Gastroenterology;  Laterality: N/A;   LAPAROSCOPIC GASTRIC BAND REMOVAL WITH LAPAROSCOPIC GASTRIC SLEEVE RESECTION     TUBAL LIGATION      Current Outpatient Medications on File Prior to Visit  Medication Sig Dispense Refill   acetaminophen  (TYLENOL ) 325 MG tablet Take 650 mg by mouth every 6 (six) hours as needed for fever.      calcium carbonate (OSCAL) 1500 (600 Ca) MG TABS tablet Take by mouth 2 (two) times daily with a meal.     loratadine  (CLARITIN ) 10 MG tablet Take 10 mg by mouth daily.     MAGNESIUM  PO Take by mouth.     Multiple Vitamin tablet Take 1 tablet by mouth daily.     Omega-3 Fatty Acids (FISH OIL) 300 MG CAPS Fish Oil  1 tablet daily     SYNTHROID  125 MCG tablet Take 125 mcg by mouth every morning.     acyclovir  (ZOVIRAX ) 400 MG tablet Take 1 tablet by mouth 2 (two) times daily.     No current facility-administered medications on file prior to visit.    Social History   Socioeconomic History   Marital status: Married    Spouse name: Not on file   Number of children: 3   Years of education:  Not on file   Highest education level: 8th grade  Occupational History   Not on file  Tobacco Use   Smoking status: Never   Smokeless tobacco: Never  Vaping Use   Vaping status: Never Used  Substance and Sexual Activity   Alcohol use: Never   Drug use: Never   Sexual activity: Yes    Birth control/protection: Surgical  Other Topics Concern   Not on file  Social History Narrative   ** Merged History Encounter **       Social Drivers of Health   Financial Resource Strain: Low Risk  (05/30/2021)   Received from [facility], [facility]   Overall Financial Resource Strain (CARDIA)    Difficulty of Paying Living Expenses: Not very hard  Food Insecurity: No Food Insecurity (06/22/2021)   Received from [facility], [facility]   Hunger Vital Sign    Worried About Running Out of Food in the Last Year: Never true     Ran Out of Food in the Last Year: Never true  Transportation Needs: No Transportation Needs (05/08/2021)   Received from [facility], [facility]   PRAPARE - Transportation    Lack of Transportation (Medical): No    Lack of Transportation (Non-Medical): No  Physical Activity: Sufficiently Active (08/10/2023)   Exercise Vital Sign    Days of Exercise per Week: 5 days    Minutes of Exercise per Session: 60 min  Stress: No Stress Concern Present (08/10/2023)   [employer] of Occupational Health - Occupational Stress Questionnaire    Feeling of Stress : Only a little  Social Connections: Unknown (10/27/2021)   Received from [facility], [facility]   Social Network    Social Network: Not on file  Intimate Partner Violence: Unknown (05/23/2023)   Received from [facility]   HITS    Over the last 12 months how often did your partner physically hurt you?: Never    Insult or Talk Down To: Not on file    Threaten Physical Harm: Not on file    Scream or Curse: Not on file    Family History  Problem Relation Age of Onset   Cerebral aneurysm Mother    Kidney disease Father    Hyperlipidemia Sister    Hypertension Sister    Hyperthyroidism Sister    Hypertension Sister    Heart attack Brother    Hypothyroidism Daughter    Stomach cancer Maternal Uncle    Heart attack Maternal Grandmother    Colon cancer Neg Hx    Colon polyps Neg Hx    Esophageal cancer Neg Hx    Rectal cancer Neg Hx     Allergies  Allergen Reactions   Naproxen Other (See Comments)    Abd pain       PE Today's Vitals   10/03/23 0940  BP: 120/64  Pulse: (!) 53  Temp: 97.7 F (36.5 C)  TempSrc: Oral  SpO2: 97%  Weight: 124 lb (56.2 kg)  Height: 4' 11.75" (1.518 m)   Body mass index is 24.42 kg/m.  Physical Exam Vitals reviewed. Exam conducted with a chaperone present.  Constitutional:      General: She is not in acute distress.    Appearance: Normal appearance.  HENT:     Head:  Normocephalic and atraumatic.     Nose: Nose normal.  Eyes:     Extraocular Movements: Extraocular movements intact.     Conjunctiva/sclera: Conjunctivae normal.  Neck:  Thyroid : No thyroid  mass, thyromegaly or thyroid  tenderness.  Pulmonary:     Effort: Pulmonary effort is normal.  Chest:     Chest wall: No mass or tenderness.  Breasts:    Right: Normal. No swelling, mass, nipple discharge, skin change or tenderness.     Left: Normal. No swelling, mass, nipple discharge, skin change or tenderness.  Abdominal:     General: There is no distension.     Palpations: Abdomen is soft.     Tenderness: There is no abdominal tenderness.  Genitourinary:    General: Normal vulva.     Exam position: Lithotomy position.     Urethra: No prolapse.     Vagina: Normal. No vaginal discharge or bleeding.     Cervix: Normal. No lesion.     Uterus: Normal. Not enlarged and not tender.      Adnexa: Right adnexa normal and left adnexa normal.    Musculoskeletal:        General: Normal range of motion.     Cervical back: Normal range of motion.  Lymphadenopathy:     Upper Body:     Right upper body: No axillary adenopathy.     Left upper body: No axillary adenopathy.     Lower Body: No right inguinal adenopathy. No left inguinal adenopathy.  Skin:    General: Skin is warm and dry.  Neurological:     General: No focal deficit present.     Mental Status: She is alert.  Psychiatric:        Mood and Affect: Mood normal.        Behavior: Behavior normal.       Assessment and Plan:        Well woman exam with routine gynecological exam Assessment & Plan: Cervical cancer screening performed according to ASCCP guidelines. Encouraged annual mammogram screening Colonoscopy UTD DXA UTD Labs and immunizations with her primary Encouraged safe sexual practices as indicated Encouraged healthy lifestyle practices with diet and exercise For patients under 50-70yo, I recommend 1200mg  calcium daily  and 600IU of vitamin D  daily.    Cervical cancer screening -     Cytology - PAP  Negative depression screening  Low libido Assessment & Plan: Also discussed multifactorial etiologies for low libido. Consider testosterone  testing. Encouraged healthy lifestyle and intentionality around physical intimacy.   Medical therapies include testosterone  placement and wellbutrin.  The patient is aware that testosterone  is not an FDA approved medication for low libido in women, and there is limited safety and efficacy data.  Testosterone  can be used with or without estrogen and progesterone. Contraindications include cardiovascular disease, hepatic disease, endometrial hyperplasia or cancer, or breast cancer. Adverse effects include mild hirsutism and acne, changes in cholesterol, and breast cancer. Standard dosing is 300 mcg of testosterone  applied topically daily. Will send in rx for 1% testosterone  cream, apply pea-sized amount to skin daily (0.5g).  1 refill provided to [facility] called by MA. Will check testosterone  today.  Normal CBC, CMP, lipids, TSH on file. Plan for blood pressure, testosterone , lipid, LFT, CBC monitoring at 3-6 months and then annually.  All questions answered.   Orders: -     Testosterone ; Testosterone  gel (1%): Apply pea-sized amount (0.5g) to skin daily.  [facility].  Dispense: 45 g; Refill: 3 -     Testos,Total,Free and SHBG (Female)  Genitourinary syndrome of menopause -     Estradiol ; Apply 1/2 gram to vulva nightly for 2 weeks  then decrease to 1/2 gram to vulva two nights a week. Do not use applicator.  Dispense: 42.5 g; Refill: 1  Reviewed safety profile of low dose vaginal estrogen, however reviewed that higher doses have been associated with DVT, breast and uterine cancer.   Romaine Closs, MD

## 2023-10-05 LAB — CYTOLOGY - PAP
Comment: NEGATIVE
Diagnosis: NEGATIVE
High risk HPV: NEGATIVE

## 2023-10-06 ENCOUNTER — Encounter: Payer: Self-pay | Admitting: Obstetrics and Gynecology

## 2023-10-06 LAB — TESTOS,TOTAL,FREE AND SHBG (FEMALE)
Free Testosterone: 1.3 pg/mL (ref 0.1–6.4)
Sex Hormone Binding: 73 nmol/L (ref 17–124)
Testosterone, Total, LC-MS-MS: 14 ng/dL (ref 2–45)

## 2023-10-10 ENCOUNTER — Encounter: Payer: Self-pay | Admitting: Obstetrics and Gynecology

## 2023-10-10 ENCOUNTER — Other Ambulatory Visit: Payer: Self-pay

## 2023-10-10 ENCOUNTER — Telehealth: Payer: Self-pay | Admitting: Family Medicine

## 2023-10-10 DIAGNOSIS — E018 Other iodine-deficiency related thyroid disorders and allied conditions: Secondary | ICD-10-CM

## 2023-10-10 MED ORDER — SYNTHROID 125 MCG PO TABS: 125.0000 ug | ORAL_TABLET | Freq: Every morning | ORAL | 0 refills | Status: DC

## 2023-10-10 NOTE — Telephone Encounter (Signed)
 Pt is requesting her Synthroid  to be called in to her pharmacy.  Pharmacy- [facility] on [address]

## 2023-10-10 NOTE — Telephone Encounter (Signed)
 Noted, called pt with information.

## 2023-10-10 NOTE — Telephone Encounter (Signed)
 Pt is requesting a call to let her know where her referral for Sleep Apnea was sent to.  She can speak enpugh english to talk on the phone.  She came in and asked about it at the front desk.

## 2023-11-01 ENCOUNTER — Other Ambulatory Visit: Payer: Self-pay

## 2023-11-01 ENCOUNTER — Telehealth: Payer: Self-pay

## 2023-11-01 DIAGNOSIS — E018 Other iodine-deficiency related thyroid disorders and allied conditions: Secondary | ICD-10-CM

## 2023-11-01 MED ORDER — SYNTHROID 125 MCG PO TABS: 125.0000 ug | ORAL_TABLET | Freq: Every morning | ORAL | 1 refills | Status: DC

## 2023-11-01 NOTE — Telephone Encounter (Signed)
 Talked to pt and medication refilled.

## 2023-11-01 NOTE — Telephone Encounter (Signed)
 Copied from CRM [id number]. Topic: General - Other >> Nov 01, 2023  3:48 PM Dorisann Garre T wrote: Reason for CRM: SYNTHROID  125 MCG tablet needs 3 months worth of medication     she needs a appeal for her medication

## 2023-11-28 ENCOUNTER — Encounter: Payer: Self-pay | Admitting: Neurology

## 2023-11-28 ENCOUNTER — Ambulatory Visit (INDEPENDENT_AMBULATORY_CARE_PROVIDER_SITE_OTHER): Admitting: Neurology

## 2023-11-28 VITALS — BP 127/81 | HR 48 | Ht 59.5 in | Wt 127.8 lb

## 2023-11-28 DIAGNOSIS — G4719 Other hypersomnia: Secondary | ICD-10-CM

## 2023-11-28 DIAGNOSIS — R634 Abnormal weight loss: Secondary | ICD-10-CM

## 2023-11-28 DIAGNOSIS — R001 Bradycardia, unspecified: Secondary | ICD-10-CM

## 2023-11-28 DIAGNOSIS — G4733 Obstructive sleep apnea (adult) (pediatric): Secondary | ICD-10-CM

## 2023-11-28 DIAGNOSIS — Z9884 Bariatric surgery status: Secondary | ICD-10-CM

## 2023-11-28 DIAGNOSIS — R351 Nocturia: Secondary | ICD-10-CM

## 2023-11-28 NOTE — Patient Instructions (Signed)
 It was nice to meet you today, as discussed, we will proceed with a home sleep test to reevaluate your sleep apnea.  Please do not use your AutoPap machine the night of testing at home so we will get diagnostic data.  We will update you after testing is completed.  If you still have sleep apnea I will maintain you on AutoPap therapy.

## 2023-11-28 NOTE — Progress Notes (Signed)
 Subjective:    Patient ID: Judith Kelly is a 53 y.o. female.  HPI    Judith Fairy, MD, PhD [facility] [address], [address] [address] [place], [place] [address]  Dear Judith Kelly,  I saw your patient, Judith Kelly, upon your kind request in my sleep clinic today for initial consultation of her sleep disorder, in particular, evaluation of her prior diagnosis of obstructive sleep apnea.  The patient is accompanied by Judith Kelly interpreter, Judith Kelly, today.  As you know, [name] is a 53 year old female with an underlying medical history of prediabetes, hypertension, osteopenia, hypothyroidism, allergic rhinitis, history of atrial fibrillation, rosacea, status post sleeve gastrectomy, who was previously diagnosed with obstructive sleep apnea and placed on PAP therapy.  Her Epworth sleepiness score is 20 out of 24, fatigue severity score is 11 out of 63..  I reviewed your office note from 08/10/2023.  Prior sleep study results are not available for my review today.  Testing was about 3 years ago, prior to achieving the significant amount of weight loss.  Her BMI in May 2022 was 42.  She has followed with [facility].  Reportedly, per office visit from 01/01/2021, her AHI during a home sleep test in 2022 was 33/hour.  She has achieved a significant amount of weight loss, sometimes she does not use her machine, she brought her AutoPap machine, DME company is [employer], set up date according to online records 10/30/2021.  I have reviewed her AutoPap compliance data for the past 90 days, she used her machine 41 out of 90 days with percent use days greater than 4 hours at 33%, indicating suboptimal compliance, average usage of 4 hours at 53 minutes for days on treatment, residual AHI at goal at 1.6/h, average pressure for the 95th percentile 11.4 cm with a range of 4 to 12 cm with EPR of 3.  Leak on the higher side with the 95th percentile at 26.8 L/min.  She  reports that sometimes she just does not put her mask on because she is too tired.  She does indicate sleepiness when she is in the car.  She does let her husband drive most of the time.  She lives with her husband.  She works as a Arboriculturist.  She goes to bed generally around 11 or 11:30 PM and rise time is around 5 or 6 AM.  She has no nightly nocturia and denies recurrent morning headaches.  She is not aware of any family history of sleep apnea.  She is a non-smoker and drinks alcohol rarely, no daily caffeine.  Her Past Medical History Is Significant For: Past Medical History:  Diagnosis Date   ALLERGIC RHINITIS 10/04/2008   Allergy    Diabetes (HCC)    FIBRILLATION, ATRIAL 04/05/2007   Annotation: 06/25/04, transient atrial fibrillation secondary to  hyperthyroidism; resolved  Qualifier: History of  By: [name]  MD, [name]     Herpes genitalia    Osteopenia    Rosacea 10/15/2006   Qualifier: Diagnosis of  By: [name] MD, [name]     Sleep apnea    Thyroid  disease     Her Past Surgical History Is Significant For: Past Surgical History:  Procedure Laterality Date   COLONOSCOPY WITH PROPOFOL  N/A 01/13/2023   Procedure: COLONOSCOPY WITH PROPOFOL ;  Surgeon: [name]., MD;  Location: [facility];  Service: Gastroenterology;  Laterality: N/A;   LAPAROSCOPIC GASTRIC BAND REMOVAL WITH LAPAROSCOPIC GASTRIC SLEEVE RESECTION     THYROID  SURGERY  TUBAL LIGATION      Her Family History Is Significant For: Family History  Problem Relation Age of Onset   Hypertension Mother    Cerebral aneurysm Mother    Heart attack Mother    Kidney disease Father    Hyperlipidemia Sister    Hypertension Sister    Hyperthyroidism Sister    Hypertension Sister    Heart attack Brother    Stomach cancer Maternal Uncle    Heart attack Maternal Grandmother    Hypothyroidism Daughter    Colon cancer Neg Hx    Colon polyps Neg Hx    Esophageal cancer Neg Hx    Rectal cancer Neg Hx      Her Social History Is Significant For: Social History   Socioeconomic History   Marital status: Married    Spouse name: Not on file   Number of children: 3   Years of education: Not on file   Highest education level: 8th grade  Occupational History   Not on file  Tobacco Use   Smoking status: Never   Smokeless tobacco: Never  Vaping Use   Vaping status: Never Used  Substance and Sexual Activity   Alcohol use: Never   Drug use: Never   Sexual activity: Yes    Birth control/protection: Surgical  Other Topics Concern   Not on file  Social History Narrative   ** Merged History Encounter **       Social Drivers of Health   Financial Resource Strain: Low Risk  (05/30/2021)   Received from [facility]   Overall Financial Resource Strain (CARDIA)    Difficulty of Paying Living Expenses: Not very hard  Food Insecurity: No Food Insecurity (06/22/2021)   Received from [facility]   Hunger Vital Sign    Within the past 12 months, you worried that your food would run out before you got the money to buy more.: Never true    Within the past 12 months, the food you bought just didn't last and you didn't have money to get more.: Never true  Transportation Needs: No Transportation Needs (05/08/2021)   Received from [facility] - Transportation    Lack of Transportation (Medical): No    Lack of Transportation (Non-Medical): No  Physical Activity: Sufficiently Active (08/10/2023)   Exercise Vital Sign    Days of Exercise per Week: 5 days    Minutes of Exercise per Session: 60 min  Stress: No Stress Concern Present (08/10/2023)   [employer] [facility] [employer] [facility] - Occupational Stress Questionnaire    Feeling of Stress : Only a little  Social Connections: Unknown (10/27/2021)   Received from [facility]   Social Network    Social Network: Not on file    Her Allergies Are:  Allergies  Allergen Reactions   Naproxen Other (See Comments)    Abd pain   :    Her Current Medications Are:  Outpatient Encounter Medications as of 11/28/2023  Medication Sig   acetaminophen  (TYLENOL ) 325 MG tablet Take 650 mg by mouth every 6 (six) hours as needed for fever.    acyclovir  (ZOVIRAX ) 400 MG tablet Take 1 tablet by mouth 2 (two) times daily. (Patient taking differently: Take 1 tablet by mouth 2 (two) times daily. As needed.)   calcium carbonate (OSCAL) 1500 (600 Ca) MG TABS tablet Take by mouth 2 (two) times daily with a meal.   loratadine  (CLARITIN ) 10 MG tablet Take 10 mg by mouth daily. (Patient taking  differently: Take 10 mg by mouth daily as needed.)   MAGNESIUM  PO Take by mouth.   Multiple Vitamin tablet Take 1 tablet by mouth daily.   Omega-3 Fatty Acids (FISH OIL) 300 MG CAPS Fish Oil  1 tablet daily   SYNTHROID  125 MCG tablet Take 1 tablet (125 mcg total) by mouth every morning.   estradiol  (ESTRACE  VAGINAL) 0.1 MG/GM vaginal cream Apply 1/2 gram to vulva nightly for 2 weeks then decrease to 1/2 gram to vulva two nights a week. Do not use applicator. (Patient not taking: Reported on 11/28/2023)   Testosterone  12.5 MG/ACT (1%) GEL Testosterone  gel (1%): Apply pea-sized amount (0.5g) to skin daily.  [facility]. (Patient not taking: Reported on 11/28/2023)   No facility-administered encounter medications on file as of 11/28/2023.  :   Review of Systems:  Out of a complete 14 point review of systems, all are reviewed and negative with the exception of these symptoms as listed below:  Review of Systems  Neurological:        Pt has been using cpap.  Had in lab sleep test in [place] prior to gastric bypass surgery. ESS 20 FSS 11. Needs supplies.      Objective:  Neurological Exam  Physical Exam Physical Examination:   Vitals:   11/28/23 0845  BP: 127/81  Pulse: (!) 48    General Examination: The patient is a very pleasant 53 y.o. female in no acute distress. She appears well-developed and well-nourished  and well groomed.   HEENT: Normocephalic, atraumatic, pupils are equal, round and reactive to light, extraocular tracking is good without limitation to gaze excursion or nystagmus noted. Hearing is grossly intact. Face is symmetric with normal facial animation. Speech is clear with no dysarthria noted. There is no hypophonia. There is no lip, neck/head, jaw or voice tremor. Neck is supple with full range of passive and active motion. There are no carotid bruits on auscultation. Oropharynx exam reveals: No significant mouth dryness, good dental hygiene, mild airway crowding secondary to small airway entry, tonsils 1+, Mallampati class I, neck circumference 13-3/8 inches.  Tongue protrudes centrally and palate elevates symmetrically.   Chest: Clear to auscultation without wheezing, rhonchi or crackles noted.  Heart: S1+S2+0, regular and normal without murmurs, rubs or gallops noted.   Abdomen: Soft, non-tender and non-distended.  Extremities: There is no pitting edema in the distal lower extremities bilaterally.   Skin: Warm and dry without trophic changes noted.   Musculoskeletal: exam reveals no obvious joint deformities.   Neurologically:  Mental status: The patient is awake, alert and oriented in all 4 spheres. Her immediate and remote memory, attention, language skills and fund of knowledge are appropriate. There is no evidence of aphasia, agnosia, apraxia or anomia. Speech is clear with normal prosody and enunciation. Thought process is linear. Mood is normal and affect is normal.  Cranial nerves II - XII are as described above under HEENT exam.  Motor exam: Normal bulk, strength and tone is noted. There is no obvious action or resting tremor.  Fine motor skills and coordination: grossly intact.  Cerebellar testing: No dysmetria or intention tremor. There is no truncal or gait ataxia.  Sensory exam: intact to light touch in the upper and lower extremities.  Gait, station and balance: She  stands easily. No veering to one side is noted. No leaning to one side is noted. Posture is age-appropriate and stance is narrow based. Gait shows normal stride length and normal  pace. No problems turning are noted.   Assessment and Plan:   In summary, Alyana Kreiter is a very pleasant 53 year old female with an underlying medical history of prediabetes, hypertension, osteopenia, hypothyroidism, allergic rhinitis, history of atrial fibrillation, rosacea, status post sleeve gastrectomy, who presents for evaluation of her obstructive sleep apnea which was in the severe range in 2022 before her weight loss surgery and before achieving a significant amount of weight loss.  She has an AutoPap machine which is relatively new.  She is advised to proceed with a repeat home sleep test at this time due to significant weight loss achieved and we will reassess her sleep apnea diagnosis as well as severity.  If she still has sleep apnea, we should maintain treatment, when she is on her current AutoPap machine her treatment settings look fine.  She is advised to follow-up after testing.  We talked about the importance of maintaining a healthy lifestyle and good sleep habits.  She is cautioned regarding driving because she indicates significant sleepiness.  She is advised not to drive when feeling sleepy and to pull over.  We also talked about bradycardia today and she is not aware if she has low heart rate typically.  Upon chart review, she has had bradycardia for the past few years.  She is advised to monitor it.  I answered all her questions today and she was in agreement with our approach.   Of note, she will be changing insurances next month and we mutually agreed to proceed with testing after her new insurance kicks in.   Thank you very much for allowing me to participate in the care of this nice patient. If I can be of any further assistance to you please do not hesitate to call me at  [phone].  Sincerely,   Judith Fairy, MD, PhD

## 2024-01-02 ENCOUNTER — Ambulatory Visit: Payer: Self-pay | Admitting: Obstetrics and Gynecology

## 2024-01-02 NOTE — Progress Notes (Deleted)
 53 y.o. [id number] postmenopausal female with osteopenia here for med follow-up. Married. Works in Education officer, environmental. Due to language barrier, an interpreter was present during the history-taking and subsequent discussion (and for part of the physical exam) with this patient.  Patient's last menstrual period was 12/17/2015.   At annual exam, she reported: Vaginal itching, used probiotics and it has since went away.  Right side of labia, has a bump that she is concerned with.  Has been there for years. Reports low libido with husband.  Curious about treatment options.  She was started on compounded testosterone  and vaginal estrogen.   GYN HISTORY: No significant history  OB History  Gravida Para Term Preterm AB Living  3 3 3   3   SAB IAB Ectopic Multiple Live Births          # Outcome Date GA Lbr Len/2nd Weight Sex Type Anes PTL Lv  3 Term           2 Term           1 Term             Past Medical History:  Diagnosis Date   ALLERGIC RHINITIS 10/04/2008   Allergy    Diabetes (HCC)    FIBRILLATION, ATRIAL 04/05/2007   Annotation: 06/25/04, transient atrial fibrillation secondary to  hyperthyroidism; resolved  Qualifier: History of  By: Rilla  MD, Anton     Herpes genitalia    Osteopenia    Rosacea 10/15/2006   Qualifier: Diagnosis of  By: Loretha MD, [name]     Sleep apnea    Thyroid  disease     Past Surgical History:  Procedure Laterality Date   COLONOSCOPY WITH PROPOFOL  N/A 01/13/2023   Procedure: COLONOSCOPY WITH PROPOFOL ;  Surgeon: Wilhelmenia Aloha Raddle., MD;  Location: [facility];  Service: Gastroenterology;  Laterality: N/A;   LAPAROSCOPIC GASTRIC BAND REMOVAL WITH LAPAROSCOPIC GASTRIC SLEEVE RESECTION     THYROID  SURGERY     TUBAL LIGATION      Current Outpatient Medications on File Prior to Visit  Medication Sig Dispense Refill   acetaminophen  (TYLENOL ) 325 MG tablet Take 650 mg by mouth every 6 (six) hours as needed for fever.      acyclovir  (ZOVIRAX ) 400  MG tablet Take 1 tablet by mouth 2 (two) times daily. (Patient taking differently: Take 1 tablet by mouth 2 (two) times daily. As needed.)     calcium carbonate (OSCAL) 1500 (600 Ca) MG TABS tablet Take by mouth 2 (two) times daily with a meal.     estradiol  (ESTRACE  VAGINAL) 0.1 MG/GM vaginal cream Apply 1/2 gram to vulva nightly for 2 weeks then decrease to 1/2 gram to vulva two nights a week. Do not use applicator. (Patient not taking: Reported on 11/28/2023) 42.5 g 1   loratadine  (CLARITIN ) 10 MG tablet Take 10 mg by mouth daily. (Patient taking differently: Take 10 mg by mouth daily as needed.)     MAGNESIUM  PO Take by mouth.     Multiple Vitamin tablet Take 1 tablet by mouth daily.     Omega-3 Fatty Acids (FISH OIL) 300 MG CAPS Fish Oil  1 tablet daily     SYNTHROID  125 MCG tablet Take 1 tablet (125 mcg total) by mouth every morning. 90 tablet 1   Testosterone  12.5 MG/ACT (1%) GEL Testosterone  gel (1%): Apply pea-sized amount (0.5g) to skin daily.  [facility] call in to [facility]. (Patient not taking: Reported on 11/28/2023) 45 g 3  No current facility-administered medications on file prior to visit.   Allergies  Allergen Reactions   Naproxen Other (See Comments)    Abd pain     PE There were no vitals filed for this visit.  There is no height or weight on file to calculate BMI.  Physical Exam Vitals reviewed.  Constitutional:      General: She is not in acute distress.    Appearance: Normal appearance.  HENT:     Head: Normocephalic and atraumatic.     Nose: Nose normal.  Eyes:     Extraocular Movements: Extraocular movements intact.     Conjunctiva/sclera: Conjunctivae normal.  Pulmonary:     Effort: Pulmonary effort is normal.  Musculoskeletal:        General: Normal range of motion.     Cervical back: Normal range of motion.  Neurological:     General: No focal deficit present.     Mental Status: She is alert.  Psychiatric:        Mood and Affect: Mood  normal.        Behavior: Behavior normal.       Assessment and Plan:        There are no diagnoses linked to this encounter.   Vera LULLA Pa, MD

## 2024-01-10 ENCOUNTER — Ambulatory Visit: Admitting: Family Medicine

## 2024-02-07 ENCOUNTER — Encounter: Payer: 59 | Admitting: Family Medicine

## 2024-02-09 ENCOUNTER — Other Ambulatory Visit: Payer: Self-pay

## 2024-02-09 ENCOUNTER — Telehealth: Payer: Self-pay | Admitting: Family Medicine

## 2024-02-09 DIAGNOSIS — E018 Other iodine-deficiency related thyroid disorders and allied conditions: Secondary | ICD-10-CM

## 2024-02-09 MED ORDER — SYNTHROID 125 MCG PO TABS: 125.0000 ug | ORAL_TABLET | Freq: Every morning | ORAL | 1 refills | Status: AC

## 2024-02-09 NOTE — Telephone Encounter (Signed)
 Medication sent to pharmacy

## 2024-02-09 NOTE — Telephone Encounter (Signed)
 Pt needs a refill on her Synthroid .  She accidentally got the date of her physical mixed up so she missed it.  It has been rescheduled for 02/29/24.  Pharmacy--[facility]

## 2024-02-29 ENCOUNTER — Encounter: Payer: Self-pay | Admitting: Family Medicine

## 2024-02-29 ENCOUNTER — Ambulatory Visit (INDEPENDENT_AMBULATORY_CARE_PROVIDER_SITE_OTHER): Admitting: Family Medicine

## 2024-02-29 VITALS — BP 130/84 | HR 53 | Temp 97.8°F | Ht 59.5 in | Wt 130.0 lb

## 2024-02-29 DIAGNOSIS — E663 Overweight: Secondary | ICD-10-CM | POA: Diagnosis not present

## 2024-02-29 DIAGNOSIS — E018 Other iodine-deficiency related thyroid disorders and allied conditions: Secondary | ICD-10-CM | POA: Diagnosis not present

## 2024-02-29 DIAGNOSIS — Z0000 Encounter for general adult medical examination without abnormal findings: Secondary | ICD-10-CM | POA: Diagnosis not present

## 2024-02-29 DIAGNOSIS — Z23 Encounter for immunization: Secondary | ICD-10-CM

## 2024-02-29 DIAGNOSIS — R7303 Prediabetes: Secondary | ICD-10-CM

## 2024-02-29 DIAGNOSIS — Z1231 Encounter for screening mammogram for malignant neoplasm of breast: Secondary | ICD-10-CM

## 2024-02-29 LAB — COMPREHENSIVE METABOLIC PANEL WITH GFR
ALT: 30 U/L (ref 0–35)
AST: 38 U/L — ABNORMAL HIGH (ref 0–37)
Albumin: 4.3 g/dL (ref 3.5–5.2)
Alkaline Phosphatase: 82 U/L (ref 39–117)
BUN: 17 mg/dL (ref 6–23)
CO2: 26 meq/L (ref 19–32)
Calcium: 9.8 mg/dL (ref 8.4–10.5)
Chloride: 105 meq/L (ref 96–112)
Creatinine, Ser: 0.69 mg/dL (ref 0.40–1.20)
GFR: 98.46 mL/min (ref >60.00)
Glucose, Bld: 74 mg/dL (ref 70–99)
Potassium: 4.8 meq/L (ref 3.5–5.1)
Sodium: 141 meq/L (ref 135–145)
Total Bilirubin: 0.7 mg/dL (ref 0.2–1.2)
Total Protein: 7.4 g/dL (ref 6.0–8.3)

## 2024-02-29 LAB — CBC WITH DIFFERENTIAL/PLATELET
Basophils Absolute: 0 K/uL (ref 0.0–0.1)
Basophils Relative: 0.8 % (ref 0.0–3.0)
Eosinophils Absolute: 0.2 K/uL (ref 0.0–0.7)
Eosinophils Relative: 4 % (ref 0.0–5.0)
HCT: 39 % (ref 36.0–46.0)
Hemoglobin: 12.8 g/dL (ref 12.0–15.0)
Lymphocytes Relative: 41.7 % (ref 12.0–46.0)
Lymphs Abs: 2.2 K/uL (ref 0.7–4.0)
MCHC: 32.7 g/dL (ref 30.0–36.0)
MCV: 90.1 fl (ref 78.0–100.0)
Monocytes Absolute: 0.5 K/uL (ref 0.1–1.0)
Monocytes Relative: 8.8 % (ref 3.0–12.0)
Neutro Abs: 2.3 K/uL (ref 1.4–7.7)
Neutrophils Relative %: 44.7 % (ref 43.0–77.0)
Platelets: 267 K/uL (ref 150.0–400.0)
RBC: 4.33 Mil/uL (ref 3.87–5.11)
RDW: 13.8 % (ref 11.5–15.5)
WBC: 5.2 K/uL (ref 4.0–10.5)

## 2024-02-29 LAB — MICROALBUMIN / CREATININE URINE RATIO
Creatinine,U: 96.5 mg/dL
Microalb Creat Ratio: 19.7 mg/g (ref 0.0–30.0)
Microalb, Ur: 1.9 mg/dL (ref 0.0–1.9)

## 2024-02-29 LAB — TSH: TSH: 1.43 u[IU]/mL (ref 0.35–5.50)

## 2024-02-29 LAB — LIPID PANEL
Cholesterol: 171 mg/dL (ref 0–200)
HDL: 75.1 mg/dL (ref >39.00)
LDL Cholesterol: 77 mg/dL (ref 0–99)
NonHDL: 96.18
Total CHOL/HDL Ratio: 2
Triglycerides: 95 mg/dL (ref 0.0–149.0)
VLDL: 19 mg/dL (ref 0.0–40.0)

## 2024-02-29 LAB — HEMOGLOBIN A1C: Hgb A1c MFr Bld: 5.4 % (ref 4.6–6.5)

## 2024-02-29 NOTE — Patient Instructions (Addendum)
-  It was nice to see you today. -Physical exam completed.  -Ordered labs ( CBC, CMP, A1c, Lipid panel-fasting, microalbumin/creatinine urine, and TSH) based on hypothyroidism, prediabetes, and BMI-overweight. Office will call with lab results and will be available via MyChart.  -Influenza vaccine provider.  -Ordered mammogram.  -Follow up in 3 weeks for additional concerns.

## 2024-02-29 NOTE — Progress Notes (Signed)
 Complete physical exam  Patient: Judith Kelly   DOB: 12/04/1970   53 y.o. Female  MRN: [id number]  Subjective:    Chief Complaint  Patient presents with   Annual Exam    Judith Kelly is a 53 y.o. female who presents today for a complete physical exam. She reports consuming a low carbohydrate, less sodium diet. Exercise: Zomba and weights, 4-5 times a week. She generally feels well. She reports sleeping well. She does not have additional problems to discuss today.    Most recent fall risk assessment:    02/29/2024    9:01 AM  Fall Risk   Falls in the past year? 0  Number falls in past yr: 0  Injury with Fall? 0  Risk for fall due to : No Fall Risks  Follow up Falls evaluation completed     Most recent depression screenings:    02/29/2024    9:01 AM 10/03/2023    9:36 AM  PHQ 2/9 Scores  PHQ - 2 Score 0 0  PHQ- 9 Score 0     Vision:Not within last year  and Dental: No current dental problems and Receives regular dental care  Past Medical History:  Diagnosis Date   ALLERGIC RHINITIS 10/04/2008   Allergy    Diabetes (HCC)    FIBRILLATION, ATRIAL 04/05/2007   Annotation: 06/25/04, transient atrial fibrillation secondary to  hyperthyroidism; resolved  Qualifier: History of  By: Rilla  MD, Anton     Herpes genitalia    Osteopenia    Rosacea 10/15/2006   Qualifier: Diagnosis of  By: Loretha MD, [name]     Sleep apnea    Thyroid  disease    Past Surgical History:  Procedure Laterality Date   COLONOSCOPY WITH PROPOFOL  N/A 01/13/2023   Procedure: COLONOSCOPY WITH PROPOFOL ;  Surgeon: Wilhelmenia Aloha Raddle., MD;  Location: [facility];  Service: Gastroenterology;  Laterality: N/A;   LAPAROSCOPIC GASTRIC BAND REMOVAL WITH LAPAROSCOPIC GASTRIC SLEEVE RESECTION     THYROID  SURGERY     TUBAL LIGATION     Social History   Tobacco Use   Smoking status: Never   Smokeless tobacco: Never  Vaping Use   Vaping status: Never Used  Substance Use Topics    Alcohol use: Never   Drug use: Never   Social History   Socioeconomic History   Marital status: Married    Spouse name: Not on file   Number of children: 3   Years of education: Not on file   Highest education level: 8th grade  Occupational History   Not on file  Tobacco Use   Smoking status: Never   Smokeless tobacco: Never  Vaping Use   Vaping status: Never Used  Substance and Sexual Activity   Alcohol use: Never   Drug use: Never   Sexual activity: Yes    Birth control/protection: Surgical  Other Topics Concern   Not on file  Social History Narrative   ** Merged History Encounter **       Social Drivers of Health   Financial Resource Strain: Low Risk  (05/30/2021)   Received from [facility]   Overall Financial Resource Strain (CARDIA)    Difficulty of Paying Living Expenses: Not very hard  Food Insecurity: No Food Insecurity (06/22/2021)   Received from [facility]   Hunger Vital Sign    Within the past 12 months, you worried that your food would run out before you got the money to buy more.: Never true  Within the past 12 months, the food you bought just didn't last and you didn't have money to get more.: Never true  Transportation Needs: No Transportation Needs (05/08/2021)   Received from [facility] - Transportation    Lack of Transportation (Medical): No    Lack of Transportation (Non-Medical): No  Physical Activity: Sufficiently Active (08/10/2023)   Exercise Vital Sign    Days of Exercise per Week: 5 days    Minutes of Exercise per Session: 60 min  Stress: No Stress Concern Present (08/10/2023)   [employer] [facility] [employer] - Occupational Stress Questionnaire    Feeling of Stress : Only a little  Social Connections: Unknown (10/27/2021)   Received from [facility]   Social Network    Social Network: Not on file  Intimate Partner Violence: Unknown (05/23/2023)   Received from [facility]   HITS    Over the last 12  months how often did your partner physically hurt you?: Never    Insult or Talk Down To: Not on file    Threaten Physical Harm: Not on file    Scream or Curse: Not on file   Family Status  Relation Name Status   Mother  Alive   Father  Deceased   Sister  Alive   Sister  Alive   Sister  Alive   Sister  Alive   Sister  Alive   Brother  Alive   Mat Uncle  (Not Specified)   MGM  (Not Specified)   Daughter  Alive   Daughter  Alive   Son  Alive   Neg Hx  (Not Specified)  No partnership data on file   Family History  Problem Relation Age of Onset   Hypertension Mother    Cerebral aneurysm Mother    Heart attack Mother    Kidney disease Father    Hyperlipidemia Sister    Hypertension Sister    Hyperthyroidism Sister    Hypertension Sister    Heart attack Brother    Stomach cancer Maternal Uncle    Heart attack Maternal Grandmother    Hypothyroidism Daughter    Colon cancer Neg Hx    Colon polyps Neg Hx    Esophageal cancer Neg Hx    Rectal cancer Neg Hx    Allergies  Allergen Reactions   Naproxen Other (See Comments)    Abd pain    Patient Care Team: Billy Philippe SAUNDERS, NP as PCP - General (Family Medicine) Dallie Vera GAILS, MD as Consulting Physician (Obstetrics and Gynecology) Buck Saucer, MD as Attending Physician (Neurology) Sumner Lorn HERO, FNP (Nurse Practitioner)   Outpatient Medications Prior to Visit  Medication Sig   acetaminophen  (TYLENOL ) 325 MG tablet Take 650 mg by mouth every 6 (six) hours as needed for fever.    acyclovir  (ZOVIRAX ) 400 MG tablet Take 1 tablet by mouth 2 (two) times daily. (Patient taking differently: Take 1 tablet by mouth 2 (two) times daily. As needed.)   calcium carbonate (OSCAL) 1500 (600 Ca) MG TABS tablet Take by mouth 2 (two) times daily with a meal.   loratadine  (CLARITIN ) 10 MG tablet Take 10 mg by mouth daily. (Patient taking differently: Take 10 mg by mouth daily as needed.)   MAGNESIUM  PO Take by mouth.   Multiple  Vitamin tablet Take 1 tablet by mouth daily.   Omega-3 Fatty Acids (FISH OIL) 300 MG CAPS Fish Oil  1 tablet daily   SYNTHROID  125 MCG tablet Take 1  tablet (125 mcg total) by mouth every morning.   [DISCONTINUED] estradiol  (ESTRACE  VAGINAL) 0.1 MG/GM vaginal cream Apply 1/2 gram to vulva nightly for 2 weeks then decrease to 1/2 gram to vulva two nights a week. Do not use applicator. (Patient not taking: Reported on 02/29/2024)   [DISCONTINUED] Testosterone  12.5 MG/ACT (1%) GEL Testosterone  gel (1%): Apply pea-sized amount (0.5g) to skin daily.  [facility]. (Patient not taking: Reported on 02/29/2024)   No facility-administered medications prior to visit.   Review of Systems  Constitutional: Negative.   HENT: Negative.    Eyes: Negative.   Respiratory: Negative.    Cardiovascular: Negative.   Gastrointestinal: Negative.   Genitourinary: Negative.   Musculoskeletal:  Positive for joint pain (Knees).  Skin:  Positive for rash.  Neurological:  Positive for dizziness.  Endo/Heme/Allergies: Negative.   Psychiatric/Behavioral: Negative.     See HPI above    Objective:   BP 130/84   Pulse (!) 53   Temp 97.8 F (36.6 C) (Oral)   Ht 4' 11.5 (1.511 m)   Wt 130 lb (59 kg)   LMP 12/17/2015   SpO2 98%   BMI 25.82 kg/m    Physical Exam Vitals reviewed.  Constitutional:      General: She is not in acute distress.    Appearance: Normal appearance. She is overweight. She is not ill-appearing, toxic-appearing or diaphoretic.  HENT:     Head: Normocephalic and atraumatic.     Right Ear: Tympanic membrane, ear canal and external ear normal. There is no impacted cerumen.     Left Ear: Tympanic membrane, ear canal and external ear normal. There is no impacted cerumen.     Nose:     Right Sinus: No maxillary sinus tenderness or frontal sinus tenderness.     Left Sinus: No maxillary sinus tenderness or frontal sinus tenderness.     Mouth/Throat:     Mouth: Mucous  membranes are moist.     Pharynx: Oropharynx is clear. Uvula midline. No pharyngeal swelling, oropharyngeal exudate, posterior oropharyngeal erythema, uvula swelling or postnasal drip.  Eyes:     General:        Right eye: No discharge.        Left eye: No discharge.     Conjunctiva/sclera: Conjunctivae normal.     Pupils: Pupils are equal, round, and reactive to light.  Neck:     Thyroid : No thyromegaly.  Cardiovascular:     Rate and Rhythm: Normal rate and regular rhythm.     Pulses:          Dorsalis pedis pulses are 2+ on the right side and 2+ on the left side.     Heart sounds: Normal heart sounds. No murmur heard.    No friction rub. No gallop.  Pulmonary:     Effort: Pulmonary effort is normal. No respiratory distress.     Breath sounds: Normal breath sounds.  Abdominal:     General: Abdomen is flat. Bowel sounds are normal. There is no distension.     Palpations: Abdomen is soft. There is no mass.     Tenderness: There is no abdominal tenderness.  Musculoskeletal:        General: Normal range of motion.     Cervical back: Normal range of motion.     Right lower leg: No edema.     Left lower leg: No edema.  Feet:     Right foot:     Protective  Sensation: 10 sites tested.  10 sites sensed.     Skin integrity: Skin integrity normal.     Toenail Condition: Right toenails are normal.     Left foot:     Protective Sensation: 10 sites tested.  10 sites sensed.     Skin integrity: Skin integrity normal.     Toenail Condition: Left toenails are normal.  Lymphadenopathy:     Cervical: No cervical adenopathy.  Skin:    General: Skin is warm and dry.  Neurological:     General: No focal deficit present.     Mental Status: She is alert and oriented to person, place, and time. Mental status is at baseline.     Motor: No weakness.     Gait: Gait normal.  Psychiatric:        Mood and Affect: Mood normal.        Behavior: Behavior normal.        Thought Content: Thought  content normal.        Judgment: Judgment normal.    Fol    Assessment & Plan:    Routine Health Maintenance and Physical Exam  Immunization History  Administered Date(s) Administered   Influenza Split 04/05/2011, 07/11/2015   Influenza Whole 06/30/2009, 05/04/2010   Influenza, Seasonal, Injecte, Preservative Fre 02/29/2024   Influenza,inj,Quad PF,6+ Mos 03/27/2019   Influenza,inj,quad, With Preservative 04/11/2020   Influenza-Unspecified 04/15/2021   PFIZER(Purple Top)SARS-COV-2 Vaccination 08/25/2019, 09/15/2019, 07/18/2020   Td 07/31/2004   Tdap 08/10/2023   Zoster Recombinant(Shingrix) 04/14/2021, 09/01/2021    Health Maintenance  Topic Date Due   OPHTHALMOLOGY EXAM  Never done   Diabetic kidney evaluation - Urine ACR  Never done   Pneumococcal Vaccine: 50+ Years (1 of 2 - PCV) Never done   Hepatitis B Vaccines 19-59 Average Risk (1 of 3 - 19+ 3-dose series) Never done   COVID-19 Vaccine (4 - 2025-26 season) 02/13/2024   HEMOGLOBIN A1C  02/07/2024   Diabetic kidney evaluation - eGFR measurement  08/09/2024   Mammogram  02/06/2025   FOOT EXAM  02/28/2025   Cervical Cancer Screening (HPV/Pap Cotest)  10/02/2028   Colonoscopy  01/12/2033   DTaP/Tdap/Td (3 - Td or Tdap) 08/09/2033   Influenza Vaccine  Completed   Hepatitis C Screening  Completed   HIV Screening  Completed   Zoster Vaccines- Shingrix  Completed   HPV VACCINES  Aged Out   Meningococcal B Vaccine  Aged Out    Discussed health benefits of physical activity, and encouraged her to engage in regular exercise appropriate for her age and condition.  Annual physical exam  HYPOTHYROIDISM, POST-RADIOACTIVE IODINE -     TSH  Prediabetes -     Microalbumin / creatinine urine ratio -     Hemoglobin A1c  Overweight (BMI 25.0-29.9) -     CBC with Differential/Platelet -     Comprehensive metabolic panel with GFR -     Hemoglobin A1c -     Lipid panel  Encounter for screening mammogram for malignant  neoplasm of breast -     3D Screening Mammogram, Left and Right; Future  Immunization due -     Flu vaccine trivalent PF, 6mos and older(Flulaval,Afluria,Fluarix,Fluzone)  1.Review health maintenance:  -Covid booster: Not participating  -Hep B vaccines: Unsure  -Influenza vaccine: Administered -Ophthalmology: Needs appointment  -PNA vaccine: Not had  -Mammogram: Ordered  2.Physical exam completed.  3.Ordered labs ( CBC, CMP, A1c, Lipid panel-fasting, microalbumin/creatinine urine, and TSH) based on hypothyroidism, prediabetes,  and BMI-overweight. Office will call with lab results and will be available via MyChart.  4. Follow up in 3 weeks for additional concerns. Spanish interpreter: Gladys Mustard.  Return in about 3 weeks (around 03/21/2024).    Alyiah Ulloa, NP

## 2024-03-01 ENCOUNTER — Ambulatory Visit: Payer: Self-pay | Admitting: Family Medicine

## 2024-03-26 ENCOUNTER — Ambulatory Visit: Admitting: Family Medicine

## 2024-03-26 ENCOUNTER — Encounter: Payer: Self-pay | Admitting: Family Medicine

## 2024-03-26 VITALS — BP 122/78 | HR 70 | Temp 97.7°F | Ht 59.5 in | Wt 132.0 lb

## 2024-03-26 DIAGNOSIS — M79641 Pain in right hand: Secondary | ICD-10-CM | POA: Diagnosis not present

## 2024-03-26 DIAGNOSIS — Z1231 Encounter for screening mammogram for malignant neoplasm of breast: Secondary | ICD-10-CM

## 2024-03-26 DIAGNOSIS — M79642 Pain in left hand: Secondary | ICD-10-CM

## 2024-03-26 DIAGNOSIS — E162 Hypoglycemia, unspecified: Secondary | ICD-10-CM | POA: Diagnosis not present

## 2024-03-26 NOTE — Patient Instructions (Addendum)
-  It was a pleasure to see you today. -Placed a referral to [facility] for hand pain. Please call the office or send a MyChart message if you do not receive a phone call or a MyChart message about appointment in 2 weeks.  -Alternate Tylenol  1000mg  and Ibuprofen 600-800mg  every 4 hours for pain. Recommend to eat something when taking Ibuprofen.   -Recommend to eat a small snack or meal every 3-4 hours to maintain blood sugar and to drink 64-100oz of water a day. Keep a dairy of low blood sugar occurences, readings, and meals. If they start to occur more frequently, follow up. A1c was stable 3 weeks ago.  -Sent in another mammogram order to a different location, [facility]. Please call the office or send a MyChart message if you do not receive a phone call about appointment in 1 week.  -Follow up in 6 months for chronic management.

## 2024-03-26 NOTE — Progress Notes (Signed)
 Established Patient Office Visit   Subjective:  Patient ID: Judith Kelly, female    DOB: [birth year]  Age: 53 y.o. MRN: [id number]  Chief Complaint  Patient presents with   Blood Sugar Problem   Hand Pain    Hand Pain    Patient is following up for further concerns. She had an annual physical exam on 02/29/2024. She is complaining of hand pain and concerns about blood sugar.  Patient is complaining of bilateral hand pain, more on the right hand. Started years ago, but the pain has become worse by being more consistent. Daily. She reports the pain occurs more in the afternoon. Unable to provide a descriptive word for pain. But, reports she feels like a bone will come out.  She has taken any thing for pain, except trying OTC pain ointment that has not helped. Patient reports she has been seen by orthopedic 2 years ago. Based on chart review, it was not with [facility], possibly with [facility]. She reports she was told she could have steroid injections in hands or try Tylenol . She opted to not have injections at that time. She thinks she had x-rays completed.   Patient is concern about her blood sugar dropping. She reports all of a sudden she feel weak and no strength. She reports she checks her blood sugar, can be down in the 50s. Last A1c was 5.4 on 02/29/2024.   ROS See HPI above     Objective:   BP 122/78   Pulse 70   Temp 97.7 F (36.5 C) (Oral)   Ht 4' 11.5 (1.511 m)   Wt 132 lb (59.9 kg)   LMP 12/17/2015   SpO2 96%   BMI 26.21 kg/m    Physical Exam Vitals reviewed.  Constitutional:      General: She is not in acute distress.    Appearance: Normal appearance. She is not ill-appearing, toxic-appearing or diaphoretic.  Eyes:     General:        Right eye: No discharge.        Left eye: No discharge.     Conjunctiva/sclera: Conjunctivae normal.  Cardiovascular:     Rate and Rhythm: Normal rate and regular rhythm.     Heart sounds: Normal heart  sounds. No murmur heard.    No friction rub. No gallop.  Pulmonary:     Effort: Pulmonary effort is normal. No respiratory distress.     Breath sounds: Normal breath sounds.  Musculoskeletal:        General: Normal range of motion.     Right hand: Swelling (Thenar eminence region) present. No deformity. Normal range of motion. Normal strength.     Left hand: No swelling or deformity. Normal range of motion. Normal strength.  Skin:    General: Skin is warm and dry.  Neurological:     General: No focal deficit present.     Mental Status: She is alert and oriented to person, place, and time. Mental status is at baseline.  Psychiatric:        Mood and Affect: Mood normal.        Behavior: Behavior normal.        Thought Content: Thought content normal.        Judgment: Judgment normal.      Assessment & Plan:  Bilateral hand pain -     Ambulatory referral to Orthopedic Surgery  Low blood sugar  Encounter for screening mammogram for malignant neoplasm of breast -  MM 3D DIAGNOSTIC MAMMOGRAM BILATERAL BREAST; Future  -Placed a referral to [facility] for hand pain. Patient is interested in steroid injections into hands.  -Alternate Tylenol  1000mg  and Ibuprofen 600-800mg  every 4 hours for pain. Recommend to eat something when taking Ibuprofen.   -Recommend to eat a small snack or meal every 3-4 hours to maintain blood sugar and to drink 64-100oz of water a day. Keep a dairy of low blood sugar occurences, readings, and meals. If they start to occur more frequently, follow up. A1c was stable 3 weeks ago.  -Sent in another mammogram order to a different location, [facility].  Advised please call the office or send a MyChart message if do not receive a phone call about appointment in 1 week.  -Spanish interpreter [place].  Return in about 6 months (around 09/24/2024) for chronic management.   Vilma Will, NP

## 2024-09-24 ENCOUNTER — Ambulatory Visit: Admitting: Family Medicine
